# Patient Record
Sex: Female | Born: 1950 | Race: White | Hispanic: No | Smoking: Former smoker
Health system: Southern US, Community
[De-identification: ages and names within clinical notes are randomized; demographics above are authoritative.]

## PROBLEM LIST (undated history)

## (undated) DIAGNOSIS — E7800 Pure hypercholesterolemia, unspecified: Secondary | ICD-10-CM

## (undated) DIAGNOSIS — I10 Essential (primary) hypertension: Secondary | ICD-10-CM

## (undated) DIAGNOSIS — I739 Peripheral vascular disease, unspecified: Secondary | ICD-10-CM

## (undated) HISTORY — PX: OTHER SURGICAL HISTORY: SHX169

## (undated) HISTORY — PX: ABDOMINAL HYSTERECTOMY: SHX81

## (undated) HISTORY — DX: Peripheral vascular disease, unspecified: I73.9

## (undated) HISTORY — PX: ENDOVENOUS ABLATION SAPHENOUS VEIN W/ LASER: SUR449

---

## 1997-12-22 ENCOUNTER — Other Ambulatory Visit: Admission: RE | Admit: 1997-12-22 | Discharge: 1997-12-22 | Payer: Self-pay | Admitting: Gastroenterology

## 1998-04-25 ENCOUNTER — Emergency Department (HOSPITAL_COMMUNITY): Admission: EM | Admit: 1998-04-25 | Discharge: 1998-04-25 | Payer: Self-pay | Admitting: Emergency Medicine

## 1998-04-25 ENCOUNTER — Encounter: Payer: Self-pay | Admitting: Emergency Medicine

## 1998-08-23 ENCOUNTER — Ambulatory Visit (HOSPITAL_COMMUNITY): Admission: RE | Admit: 1998-08-23 | Discharge: 1998-08-23 | Payer: Self-pay | Admitting: *Deleted

## 1999-09-26 ENCOUNTER — Encounter: Admission: RE | Admit: 1999-09-26 | Discharge: 1999-09-26 | Payer: Self-pay | Admitting: Specialist

## 1999-09-26 ENCOUNTER — Encounter: Payer: Self-pay | Admitting: Specialist

## 2015-05-26 ENCOUNTER — Emergency Department (HOSPITAL_COMMUNITY)
Admission: EM | Admit: 2015-05-26 | Discharge: 2015-05-26 | Disposition: A | Discharge status: Home / Self Care | Payer: BLUE CROSS/BLUE SHIELD | Attending: Emergency Medicine | Admitting: Emergency Medicine

## 2015-05-26 ENCOUNTER — Emergency Department (HOSPITAL_COMMUNITY): Payer: BLUE CROSS/BLUE SHIELD

## 2015-05-26 ENCOUNTER — Encounter (HOSPITAL_COMMUNITY): Payer: Self-pay

## 2015-05-26 DIAGNOSIS — S8251XA Displaced fracture of medial malleolus of right tibia, initial encounter for closed fracture: Secondary | ICD-10-CM | POA: Diagnosis not present

## 2015-05-26 DIAGNOSIS — I10 Essential (primary) hypertension: Secondary | ICD-10-CM | POA: Diagnosis not present

## 2015-05-26 DIAGNOSIS — W1842XA Slipping, tripping and stumbling without falling due to stepping into hole or opening, initial encounter: Secondary | ICD-10-CM | POA: Diagnosis not present

## 2015-05-26 DIAGNOSIS — Z8639 Personal history of other endocrine, nutritional and metabolic disease: Secondary | ICD-10-CM | POA: Insufficient documentation

## 2015-05-26 DIAGNOSIS — S8261XA Displaced fracture of lateral malleolus of right fibula, initial encounter for closed fracture: Secondary | ICD-10-CM | POA: Diagnosis not present

## 2015-05-26 DIAGNOSIS — Y998 Other external cause status: Secondary | ICD-10-CM | POA: Insufficient documentation

## 2015-05-26 DIAGNOSIS — S82891A Other fracture of right lower leg, initial encounter for closed fracture: Secondary | ICD-10-CM

## 2015-05-26 DIAGNOSIS — Y9389 Activity, other specified: Secondary | ICD-10-CM | POA: Diagnosis not present

## 2015-05-26 DIAGNOSIS — S99911A Unspecified injury of right ankle, initial encounter: Secondary | ICD-10-CM | POA: Diagnosis present

## 2015-05-26 DIAGNOSIS — Y92007 Garden or yard of unspecified non-institutional (private) residence as the place of occurrence of the external cause: Secondary | ICD-10-CM | POA: Diagnosis not present

## 2015-05-26 HISTORY — DX: Pure hypercholesterolemia, unspecified: E78.00

## 2015-05-26 HISTORY — DX: Essential (primary) hypertension: I10

## 2015-05-26 MED ORDER — ONDANSETRON 4 MG PO TBDP
4.0000 mg | ORAL_TABLET | Freq: Once | ORAL | Status: AC
  Administered 2015-05-26: 4 mg via ORAL
  Filled 2015-05-26: qty 1

## 2015-05-26 MED ORDER — HYDROCODONE-ACETAMINOPHEN 5-325 MG PO TABS
1.0000 | ORAL_TABLET | Freq: Once | ORAL | Status: AC
  Administered 2015-05-26: 1 via ORAL
  Filled 2015-05-26: qty 1

## 2015-05-26 MED ORDER — HYDROCODONE-ACETAMINOPHEN 5-325 MG PO TABS: ORAL_TABLET | ORAL | Status: DC

## 2015-05-26 MED ORDER — NAPROXEN 500 MG PO TABS: 500.0000 mg | ORAL_TABLET | Freq: Two times a day (BID) | ORAL | Status: DC

## 2015-05-26 NOTE — ED Notes (Signed)
Pt verbalized understanding of no driving and to use caution within 4 hours of taking pain meds due to meds cause drowsiness 

## 2015-05-26 NOTE — ED Notes (Signed)
Pt reports stepped in a  Hole and hurt right ankle last night.  Swelling and bruising noted.  Pedal pulse present.

## 2015-05-26 NOTE — Discharge Instructions (Signed)
Walking Boot °A walking boot (controlled ankle motion boot or CAM walker) is a removable boot-shaped splint that holds your foot or ankle in place after an injury or a medical procedure. This helps with healing and prevents further injury. A walking boot has a stiff, rigid outer frame that limits movement and supports your leg and foot. The inner lining is a layer of padded material. Walking boots usually have several adjustable straps to secure them over the foot. °Your health care provider may prescribe a walking boot if it is okay for you to use your injured foot to support your body weight. How much you can walk with the boot on will depend on the type and severity of your injury. Your health care provider will recommend the best boot for you based on your condition. °HOW DO I PUT ON MY WALKING BOOT? °There are different types of walking boots. Each type of boot has specific instructions about how to wear it properly. Follow instructions from your health care provider about wearing yours. In general: °· Sit down to put on your boot. This is more comfortable and it helps to prevent falls. °· Open up the boot fully. Place your foot into the boot so that your heel rests against the back. °· Your toes should be supported by the base of the boot, but they should not hang over the front. °· Adjust the straps so the boot fits securely but is not too tight. °· Do not bend the hard frame of the boot to get a good fit. °· Ask someone to help you put on the boot, if needed. °WHAT ARE SOME TIPS FOR WALKING WITH A WALKING BOOT? °· Do not try to walk without wearing the boot unless your health care provider has approved. °· Rest your injured leg as much as possible. °· Use other assistive walking devices as told by your health care provider. These include crutches and canes. °· On your other foot, wear a shoe with a heel that is close to the height of the boot. °· Be very careful when walking on surfaces that are uneven or  wet. °HOW CAN I REDUCE SWELLING? °· Rest your injured foot or leg as much as possible. °· If directed, apply ice to the injured area: °¨ Put ice in a plastic bag. °¨ Place a towel between your skin and the bag. °¨ Leave the ice on for 20 minutes, 2-3 times a day for two days or as told by your health care provider. °· Keep your injured leg raised (elevated) above the level of your heart for 2-3 hours each day or as told by your health care provider. °· If swelling gets worse, loosen the boot and rest and raise your foot. °· Contact your health care provider if swelling does not get better or if it gets worse over time. °WHAT SKIN CARE PRACTICES SHOULD I FOLLOW? °· Wear a long sock to protect your foot and leg from rubbing inside the boot. °· Take off the boot one time per day to check the injured area. °· Follow instructions from your health care provider about taking care of your incision or wound, if this applies. °· Clean and wash the injured area as told by your health care provider. °· Gently dry your foot and leg before putting the boot back on. °· Contact your health care provider if a wound is getting worse or if your skin becomes red, painful, or irritated. °ARE THERE ANY ACTIVITY RESTRICTIONS? °Activity   restrictions depend on the type and severity of your injury. Follow instructions from your health care provider. °· Bathe and shower as directed by your health care provider. °· Do not do activities that could make your injury worse. °· Do not drive if your affected foot is one that you usually use for driving. °HOW SHOULD I KEEP MY BOOT CLEAN? °· Clean the frame and the liner of the boot by hand. Use a washcloth with mild soap and water. °· Do not use chemical cleaning products. These could irritate your skin, especially if you have a wound or an incision. °· Do not soak the liner of the boot. °· Do not put any part of the boot in a washing machine or a clothes dryer. °· Allow the boot to air dry  completely before you put it back on your foot. °  °This information is not intended to replace advice given to you by your health care provider. Make sure you discuss any questions you have with your health care provider. °  °Document Released: 11/16/2014 Document Reviewed: 11/16/2014 °Elsevier Interactive Patient Education ©2016 [employer] ° °

## 2015-05-27 NOTE — ED Provider Notes (Signed)
CSN: [id number]     Arrival date & time 05/26/15  1134 History   First MD Initiated Contact with Patient 05/26/15 1200     Chief Complaint  Patient presents with  . Ankle Pain     (Consider location/radiation/quality/duration/timing/severity/associated sxs/prior Treatment) HPI  Carolyn Robinson is a 64 y.o. female who presents to the Emergency Department complaining of right ankle pain and swelling.  She states that she stepped in a hole in the yard on the evening prior to arrival.  She complains of throbbing pain to the ankle all evening and she is unable to bear weight due to pain.  She states she has kept her foot elevated and ice applied.  She denies numbness or weakness, proximal pain, open wounds.   Past Medical History  Diagnosis Date  . Hypertension   . Hypercholesterolemia    Past Surgical History  Procedure Laterality Date  . Stent in r leg    . Abdominal hysterectomy     No family history on file. Social History  Substance Use Topics  . Smoking status: Never Smoker   . Smokeless tobacco: None  . Alcohol Use: Yes     Comment: occ   OB History    No data available     Review of Systems  Constitutional: Negative for fever and chills.  Musculoskeletal: Positive for joint swelling and arthralgias (right ankle pain).  Skin: Negative for color change and wound.  Neurological: Negative for weakness and numbness.  All other systems reviewed and are negative.     Allergies  Review of patient's allergies indicates no known allergies.  Home Medications   Prior to Admission medications   Medication Sig Start Date End Date Taking? Authorizing Provider  HYDROcodone-acetaminophen (NORCO/VICODIN) 5-325 MG tablet Take one tab po q 4-6 hrs prn pain 05/26/15   Jaydeen Odor, PA-C  naproxen (NAPROSYN) 500 MG tablet Take 1 tablet (500 mg total) by mouth 2 (two) times daily with a meal. 05/26/15   Tanaja Ganger, PA-C   BP 150/95 mmHg  Pulse 84  Temp(Src) 97.9 F (36.6  C) (Oral)  Resp 18  Ht  (1.575 m)  Wt 135 lb (61.236 kg)  BMI 24.69 kg/m2  SpO2 100% Physical Exam  Constitutional: She is oriented to person, place, and time. She appears well-developed and well-nourished. No distress.  HENT:  Head: Atraumatic.  Neck: Normal range of motion.  Cardiovascular: Normal rate, regular rhythm and intact distal pulses.   Pulmonary/Chest: Effort normal and breath sounds normal. She exhibits no tenderness.  Musculoskeletal: She exhibits edema and tenderness.  Mild to moderate edema and ecchymosis of the medial and lateral right ankle.  DP pulse is brisk,distal sensation intact.  No bony deformity.  No proximal tenderness. Compartments soft  Neurological: She is alert and oriented to person, place, and time. She exhibits normal muscle tone. Coordination normal.  Skin: Skin is warm and dry.  Nursing note and vitals reviewed.   ED Course  Procedures (including critical care time) Labs Review Labs Reviewed - No data to display  Imaging Review Dg Ankle Complete Right  05/26/2015  CLINICAL DATA:  Ankle pain. EXAM: RIGHT ANKLE - COMPLETE 3+ VIEW COMPARISON:  No prior exams available for comparison. FINDINGS: Prominent soft tissue swelling is noted lateral malleolus. Tiny avulsion fracture from the lateral malleolus noted. Subtle medial malleolar nondisplaced fracture cannot be completely excluded. No other focal abnormality identified. IMPRESSION: 1. Prominent soft tissue swelling noted over the lateral malleolus. Subtle avulsion fracture from  the distal tip of the lateral malleolus is noted. 2. Cannot exclude subtle avulsion fracture from the distal tip of the medial malleolus. Electronically Signed   By: Maisie Fushomas  Register   On: 05/26/2015 12:48   I have personally reviewed and evaluated these images and lab results as part of my medical decision-making.    MDM   Final diagnoses:  Ankle fracture, right, closed, initial encounter    Pt is well appearing.   Cam walker applied.  Pain improved, remains NV intact.  Pt agrees to close orthopedic f/u.  rx for naprosyn and vicodin.      Pauline Ausammy Lema Heinkel, PA-C 05/27/15 2248  Donnetta HutchingBrian Cook, MD 05/30/15 1130

## 2015-06-02 ENCOUNTER — Ambulatory Visit (INDEPENDENT_AMBULATORY_CARE_PROVIDER_SITE_OTHER): Payer: BLUE CROSS/BLUE SHIELD | Admitting: Orthopedic Surgery

## 2015-06-02 DIAGNOSIS — S82841A Displaced bimalleolar fracture of right lower leg, initial encounter for closed fracture: Secondary | ICD-10-CM | POA: Diagnosis not present

## 2015-06-02 MED ORDER — HYDROCODONE-ACETAMINOPHEN 5-325 MG PO TABS: 1.0000 | ORAL_TABLET | Freq: Four times a day (QID) | ORAL | Status: DC | PRN

## 2015-06-02 NOTE — Patient Instructions (Signed)
CONTINUE WEARING BRACE

## 2015-06-02 NOTE — Progress Notes (Signed)
   Subjective:    Patient ID: Carolyn Robinson, female    DOB: 06/23/1951, 64 y.o.   MRN: [id number] Right ankle pain.   HPI On 05/26/2015 the patient was at home came down a step tripped her right foot and ankle went into a divot in the ground and she twisted and fell fracturing her medial lateral malleolus but they're both nondisplaced fracture. She complains of pain initially severe now moderate over the medial and lateral aspects of the right ankle with painful weightbearing swelling and some loss of motion. Symptoms are worse with ambulating. She has much relief with a Cam Walker and Norco for pain   Review of Systems neurologic symptoms negative for numbness and tingling Skin ecchymotic    Objective:   Physical Exam Normal grooming and hygiene She is awake alert and oriented 3 mood and affect are normal Gen. appearance ectomorphic body habitus Ambulatory status supported by Verdene Rioam Walker she has a noticeable limp Right ankle was tenderness medial and lateral malleoli just distal to the tips of each, we see swelling throughout the foot and ankle Flexion dorsiflexion is 5 plantar flexion is 20 Stability drawer test is normal Plantar flexion strength is excellent grade 5 Sensation remains intact throughout the foot Skin ecchymotic but intact Pulses good good capillary refill Left ankle comparison normal range of motion stability and strength neurovascular exam intact skin normal        Assessment & Plan:  Right ankle medial and lateral avulsion fractures  The report is below my interpretation is that these are tiny avulsion fractures consistent more with sprain of the ankle than fracture  CLINICAL DATA: Ankle pain.  EXAM: RIGHT ANKLE - COMPLETE 3+ VIEW  COMPARISON: No prior exams available for comparison.  FINDINGS: Prominent soft tissue swelling is noted lateral malleolus. Tiny avulsion fracture from the lateral malleolus noted. Subtle medial malleolar nondisplaced  fracture cannot be completely excluded. No other focal abnormality identified.  IMPRESSION: 1. Prominent soft tissue swelling noted over the lateral malleolus. Subtle avulsion fracture from the distal tip of the lateral malleolus is noted.  2. Cannot exclude subtle avulsion fracture from the distal tip of the medial malleolus.   Electronically Signed  By: Maisie Fushomas Register  On: 05/26/2015 12:48   My plan is to keep her in a Cam Walker weightbearing as tolerated return 6 weeks for clinical evaluation

## 2015-07-07 ENCOUNTER — Ambulatory Visit (INDEPENDENT_AMBULATORY_CARE_PROVIDER_SITE_OTHER): Payer: BLUE CROSS/BLUE SHIELD | Admitting: Orthopedic Surgery

## 2015-07-07 VITALS — BP 154/103 | Ht 62.0 in | Wt 135.0 lb

## 2015-07-07 DIAGNOSIS — S82841S Displaced bimalleolar fracture of right lower leg, sequela: Secondary | ICD-10-CM | POA: Diagnosis not present

## 2015-07-07 NOTE — Patient Instructions (Signed)
Regular shoes, keep moving

## 2015-07-07 NOTE — Progress Notes (Signed)
FOLLOW UP  Chief Complaint  Patient presents with  . Follow-up    5 week recheck on right ankle fracture, DOI 05-26-15. No xray needed this visit.    Avulsion fractures medial and lateral malleolus of the right ankle treated with Cam Walker for 6-7 weeks. She has some mild swelling laterally and stiffness but clinical exam is normal except for the swelling  Recommend weightbearing as tolerated active range of motion exercises follow-up as needed

## 2015-11-14 ENCOUNTER — Ambulatory Visit: Payer: BLUE CROSS/BLUE SHIELD | Admitting: Orthopedic Surgery

## 2015-11-28 ENCOUNTER — Ambulatory Visit (INDEPENDENT_AMBULATORY_CARE_PROVIDER_SITE_OTHER): Payer: BLUE CROSS/BLUE SHIELD

## 2015-11-28 ENCOUNTER — Ambulatory Visit (INDEPENDENT_AMBULATORY_CARE_PROVIDER_SITE_OTHER): Payer: BLUE CROSS/BLUE SHIELD | Admitting: Orthopedic Surgery

## 2015-11-28 VITALS — BP 166/98 | HR 94 | Ht 62.0 in | Wt 136.0 lb

## 2015-11-28 DIAGNOSIS — M25571 Pain in right ankle and joints of right foot: Secondary | ICD-10-CM

## 2015-11-28 MED ORDER — NABUMETONE 500 MG PO TABS: 500.0000 mg | ORAL_TABLET | Freq: Two times a day (BID) | ORAL | Status: DC

## 2015-11-28 NOTE — Progress Notes (Signed)
Patient ID: Luana ShuJanice Pfiffner, female   DOB: 10/30/1950, 65 y.o.   MRN: [id number]  Chief Complaint  Patient presents with  . Ankle Pain    Right ankle pain and swelling, DOI 07/06/16    HPI 65 year old female had an avulsion fracture to her right ankle back in November it healed well but she comes in complaining of continued pain and swelling especially after being on her feet a long time  Location right ankle Quality of dull ache Severity moderate Duration since November Timing with activity Unrelieved by Naprosyn  Review of Systems  Constitutional: Negative for fever and chills.  Neurological: Negative for tingling.     Past Medical History  Diagnosis Date  . Hypertension   . Hypercholesterolemia     BP 166/98 mmHg  Pulse 94  Ht 5\' 2"  (1.575 m)  Wt 136 lb (61.689 kg)  BMI 24.87 kg/m2  Physical Exam Physical Exam  Constitutional: The patient is oriented to person, place, and time. The patient appears well-developed and well-nourished. No distress.  Cardiovascular: Intact distal pulses.   Neurological: The patient is alert and oriented to person, place, and time. The patient exhibits normal muscle tone. Coordination normal.  Skin: Skin is warm and dry. No rash noted. The patient is not diaphoretic. No erythema. No pallor.  Psychiatric: The patient has a normal mood and affect. Her behavior is normal. Judgment and thought content normal.   Ortho Exam  Left ankle normal range of motion stability  Right ankle is tender medially and laterally but there is no swelling range of motion is normal she has some pain with plantar flexion ankle is stable no pain strength is normal inversion and eversion dorsiflexion plantar flexion skin is normal no rash no ulceration no calluses. Pulses are normal temperature is normal no edema. Sensation is normal. Ambulation no issues there. Gait is normal    ASSESSMENT AND PLAN   X-ray shows no fracture normal ankle joint  Impression ankle pain  inflammation after avulsion fracture primarily ligament injury  Recommend anti-inflammatory  Meds ordered this encounter  Medications  . nabumetone (RELAFEN) 500 MG tablet    Sig: Take 1 tablet (500 mg total) by mouth 2 (two) times daily.    Dispense:  60 tablet    Refill:  5

## 2018-11-24 LAB — HM COLONOSCOPY

## 2019-05-08 ENCOUNTER — Other Ambulatory Visit: Payer: Self-pay

## 2019-05-08 ENCOUNTER — Encounter (HOSPITAL_COMMUNITY): Payer: Self-pay | Admitting: Emergency Medicine

## 2019-05-08 DIAGNOSIS — K5900 Constipation, unspecified: Secondary | ICD-10-CM | POA: Diagnosis not present

## 2019-05-08 DIAGNOSIS — Z20828 Contact with and (suspected) exposure to other viral communicable diseases: Secondary | ICD-10-CM | POA: Diagnosis not present

## 2019-05-08 DIAGNOSIS — R197 Diarrhea, unspecified: Secondary | ICD-10-CM | POA: Diagnosis not present

## 2019-05-08 DIAGNOSIS — R110 Nausea: Secondary | ICD-10-CM | POA: Insufficient documentation

## 2019-05-08 DIAGNOSIS — N390 Urinary tract infection, site not specified: Secondary | ICD-10-CM | POA: Insufficient documentation

## 2019-05-08 DIAGNOSIS — I10 Essential (primary) hypertension: Secondary | ICD-10-CM | POA: Insufficient documentation

## 2019-05-08 DIAGNOSIS — R109 Unspecified abdominal pain: Secondary | ICD-10-CM | POA: Diagnosis present

## 2019-05-08 DIAGNOSIS — Z79899 Other long term (current) drug therapy: Secondary | ICD-10-CM | POA: Diagnosis not present

## 2019-05-08 DIAGNOSIS — K3580 Unspecified acute appendicitis: Secondary | ICD-10-CM | POA: Insufficient documentation

## 2019-05-08 LAB — CBC
HCT: 47.3 % — ABNORMAL HIGH (ref 36.0–46.0)
Hemoglobin: 15.6 g/dL — ABNORMAL HIGH (ref 12.0–15.0)
MCH: 34.5 pg — ABNORMAL HIGH (ref 26.0–34.0)
MCHC: 33 g/dL (ref 30.0–36.0)
MCV: 104.6 fL — ABNORMAL HIGH (ref 80.0–100.0)
Platelets: 269 10*3/uL (ref 150–400)
RBC: 4.52 MIL/uL (ref 3.87–5.11)
RDW: 14.7 % (ref 11.5–15.5)
WBC: 9.5 10*3/uL (ref 4.0–10.5)
nRBC: 0 % (ref 0.0–0.2)

## 2019-05-08 LAB — COMPREHENSIVE METABOLIC PANEL
ALT: 43 U/L (ref 0–44)
AST: 71 U/L — ABNORMAL HIGH (ref 15–41)
Albumin: 4.3 g/dL (ref 3.5–5.0)
Alkaline Phosphatase: 95 U/L (ref 38–126)
Anion gap: 9 (ref 5–15)
BUN: 10 mg/dL (ref 8–23)
CO2: 24 mmol/L (ref 22–32)
Calcium: 9.7 mg/dL (ref 8.9–10.3)
Chloride: 105 mmol/L (ref 98–111)
Creatinine, Ser: 0.97 mg/dL (ref 0.44–1.00)
GFR calc Af Amer: >60 mL/min (ref >60)
GFR calc non Af Amer: >60 mL/min (ref >60)
Glucose, Bld: 103 mg/dL — ABNORMAL HIGH (ref 70–99)
Potassium: 4.2 mmol/L (ref 3.5–5.1)
Sodium: 138 mmol/L (ref 135–145)
Total Bilirubin: 0.7 mg/dL (ref 0.3–1.2)
Total Protein: 7.9 g/dL (ref 6.5–8.1)

## 2019-05-08 LAB — LIPASE, BLOOD: Lipase: 21 U/L (ref 11–51)

## 2019-05-08 NOTE — ED Triage Notes (Signed)
Pt states that she is having right sided lower abd pain. It started yesterday

## 2019-05-09 ENCOUNTER — Emergency Department (HOSPITAL_COMMUNITY): Payer: Medicare HMO

## 2019-05-09 ENCOUNTER — Encounter (HOSPITAL_COMMUNITY): Payer: Self-pay | Admitting: General Surgery

## 2019-05-09 ENCOUNTER — Emergency Department (HOSPITAL_COMMUNITY)
Admission: EM | Admit: 2019-05-09 | Discharge: 2019-05-09 | Disposition: A | Discharge status: Home / Self Care | Payer: Medicare HMO | Attending: Emergency Medicine | Admitting: Emergency Medicine

## 2019-05-09 DIAGNOSIS — R109 Unspecified abdominal pain: Secondary | ICD-10-CM

## 2019-05-09 DIAGNOSIS — N390 Urinary tract infection, site not specified: Secondary | ICD-10-CM

## 2019-05-09 DIAGNOSIS — K3580 Unspecified acute appendicitis: Secondary | ICD-10-CM

## 2019-05-09 LAB — CBC WITH DIFFERENTIAL/PLATELET
Abs Immature Granulocytes: 0.05 10*3/uL (ref 0.00–0.07)
Basophils Absolute: 0.1 10*3/uL (ref 0.0–0.1)
Basophils Relative: 1 %
Eosinophils Absolute: 0.2 10*3/uL (ref 0.0–0.5)
Eosinophils Relative: 2 %
HCT: 45.3 % (ref 36.0–46.0)
Hemoglobin: 14.5 g/dL (ref 12.0–15.0)
Immature Granulocytes: 1 %
Lymphocytes Relative: 23 %
Lymphs Abs: 2.1 10*3/uL (ref 0.7–4.0)
MCH: 33.9 pg (ref 26.0–34.0)
MCHC: 32 g/dL (ref 30.0–36.0)
MCV: 105.8 fL — ABNORMAL HIGH (ref 80.0–100.0)
Monocytes Absolute: 0.6 10*3/uL (ref 0.1–1.0)
Monocytes Relative: 6 %
Neutro Abs: 6.2 10*3/uL (ref 1.7–7.7)
Neutrophils Relative %: 67 %
Platelets: 253 10*3/uL (ref 150–400)
RBC: 4.28 MIL/uL (ref 3.87–5.11)
RDW: 14.8 % (ref 11.5–15.5)
WBC: 9.1 10*3/uL (ref 4.0–10.5)
nRBC: 0 % (ref 0.0–0.2)

## 2019-05-09 LAB — URINALYSIS, ROUTINE W REFLEX MICROSCOPIC
Glucose, UA: NEGATIVE mg/dL
Hgb urine dipstick: NEGATIVE
Ketones, ur: 5 mg/dL — AB
Nitrite: NEGATIVE
Protein, ur: 30 mg/dL — AB
Specific Gravity, Urine: 1.024 (ref 1.005–1.030)
pH: 5 (ref 5.0–8.0)

## 2019-05-09 LAB — SARS CORONAVIRUS 2 BY RT PCR (HOSPITAL ORDER, PERFORMED IN ~~LOC~~ HOSPITAL LAB): SARS Coronavirus 2: NEGATIVE

## 2019-05-09 MED ORDER — FENTANYL CITRATE (PF) 100 MCG/2ML IJ SOLN
50.0000 ug | Freq: Once | INTRAMUSCULAR | Status: AC
  Administered 2019-05-09: 50 ug via INTRAVENOUS
  Filled 2019-05-09: qty 2

## 2019-05-09 MED ORDER — CEPHALEXIN 500 MG PO CAPS: 500.0000 mg | ORAL_CAPSULE | Freq: Four times a day (QID) | ORAL | 0 refills | Status: DC

## 2019-05-09 MED ORDER — METRONIDAZOLE IN NACL 5-0.79 MG/ML-% IV SOLN
500.0000 mg | Freq: Once | INTRAVENOUS | Status: AC
  Administered 2019-05-09: 500 mg via INTRAVENOUS
  Filled 2019-05-09: qty 100

## 2019-05-09 MED ORDER — ONDANSETRON HCL 4 MG/2ML IJ SOLN
4.0000 mg | Freq: Once | INTRAMUSCULAR | Status: AC
  Administered 2019-05-09: 04:00:00 4 mg via INTRAVENOUS
  Filled 2019-05-09: qty 2

## 2019-05-09 MED ORDER — IOHEXOL 300 MG/ML  SOLN
100.0000 mL | Freq: Once | INTRAMUSCULAR | Status: AC | PRN
  Administered 2019-05-09: 100 mL via INTRAVENOUS

## 2019-05-09 MED ORDER — HYDROCODONE-ACETAMINOPHEN 5-325 MG PO TABS: 1.0000 | ORAL_TABLET | ORAL | 0 refills | Status: DC | PRN

## 2019-05-09 MED ORDER — SODIUM CHLORIDE 0.9 % IV SOLN
2.0000 g | Freq: Once | INTRAVENOUS | Status: AC
  Administered 2019-05-09: 2 g via INTRAVENOUS
  Filled 2019-05-09: qty 20

## 2019-05-09 NOTE — Discharge Instructions (Signed)
We have sent prescriptions to the pharmacy of your choice to treat pain and possible urinary tract infection.  Start with a clear liquid diet then gradually advance to bland foods as tolerated.  Make sure you follow-up with Dr. Constance Haw, on Tuesday as scheduled.  Return here, if needed, for problems.

## 2019-05-09 NOTE — Consult Note (Signed)
[facility] Consult   Reason for Consult: Abdominal pain, r/o appendicitis  Referring Physician:  Dr. Wyvonnia Dusky   Chief Complaint    Abdominal Pain      HPI: Camora Tremain is a 68 y.o. female with a history of HTN and HLD who presented with a 2 day history of RLQ pain that has not improved. She says she woke up Thursday and was having pain and some associated fevers and chills. She also has had some anorexia and constipation related to these issues. She came to the ED yesterday afternoon and was finally seen after waiting about 12 hrs.  She reports that she continues to have RLQ pain. She had a urine sample she describes as dark and cloudy. She does not have any burning with urination presently.  She has had a colonoscopy 2 months ago, and was told this was normal. This was done by Dr. Posey Pronto in [place], [place]. She is worried about her colonoscopy due to an extensive family history of CRC on her mother and father's sides.   Past Medical History:  Diagnosis Date  . Hypercholesterolemia   . Hypertension     Past Surgical History:  Procedure Laterality Date  . ABDOMINAL HYSTERECTOMY    . ENDOVENOUS ABLATION SAPHENOUS VEIN W/ LASER Bilateral   . stent in r leg      Family History  Problem Relation Age of Onset  . Colon cancer Paternal Uncle   . Colon cancer Paternal Uncle   . Colon cancer Maternal Aunt   . Colon cancer Cousin     Social History   Tobacco Use  . Smoking status: Former Smoker    Quit date: 2003    Years since quitting: 17.8  . Smokeless tobacco: Never Used  Substance Use Topics  . Alcohol use: Yes    Comment: occ  . Drug use: No    Medications: I have reviewed the patient's current medications. No current facility-administered medications for this encounter.    Current Outpatient Medications  Medication Sig Dispense Refill Last Dose  . albuterol (PROVENTIL HFA;VENTOLIN HFA) 108 (90 BASE) MCG/ACT inhaler Inhale into the lungs every 6 (six)  hours as needed for wheezing or shortness of breath.     Marland Kitchen amLODipine (NORVASC) 5 MG tablet Take 5 mg by mouth 2 (two) times daily.     Marland Kitchen atorvastatin (LIPITOR) 40 MG tablet Take 40 mg by mouth daily.     . cephALEXin (KEFLEX) 500 MG capsule Take 1 capsule (500 mg total) by mouth 4 (four) times daily. 28 capsule 0   . clopidogrel (PLAVIX) 75 MG tablet Take 75 mg by mouth daily.     Marland Kitchen FLUoxetine (PROZAC) 10 MG tablet Take 10 mg by mouth daily.     . furosemide (LASIX) 20 MG tablet Take 20 mg by mouth.     Marland Kitchen HYDROcodone-acetaminophen (NORCO) 5-325 MG tablet Take 1 tablet by mouth every 4 (four) hours as needed. 20 tablet 0   . levocetirizine (XYZAL) 5 MG tablet Take 5 mg by mouth every evening.     Marland Kitchen losartan (COZAAR) 100 MG tablet Take 100 mg by mouth daily.     . nabumetone (RELAFEN) 500 MG tablet Take 1 tablet (500 mg total) by mouth 2 (two) times daily. 60 tablet 5     Allergies  Allergen Reactions  . Ace Inhibitors   . Ciprofloxacin   . Ezetimibe   . Fenofibrate   . Hctz [Hydrochlorothiazide]   . Losartan   .  Sulfa Antibiotics      ROS:  A comprehensive review of systems was negative except for: Gastrointestinal: positive for abdominal pain, constipation and nausea  Blood pressure 140/85, pulse 79, temperature 98 F (36.7 C), temperature source Oral, resp. rate 15, height 5\' 2"  (1.575 m), weight 61.2 kg, SpO2 99 %. Physical Exam Vitals signs reviewed.  Constitutional:      Appearance: She is well-developed.  HENT:     Head: Normocephalic and atraumatic.  Eyes:     Extraocular Movements: Extraocular movements intact.  Cardiovascular:     Rate and Rhythm: Normal rate.  Pulmonary:     Effort: Pulmonary effort is normal.  Abdominal:     General: Abdomen is flat. There is no distension.     Tenderness: There is abdominal tenderness in the right lower quadrant and suprapubic area.  Musculoskeletal:     Comments: Moves all extremities, no edema  Skin:    General: Skin is  warm and dry.  Neurological:     General: No focal deficit present.     Mental Status: She is alert and oriented to person, place, and time.  Psychiatric:        Mood and Affect: Mood normal.        Behavior: Behavior normal.     Results: Results for orders placed or performed during the hospital encounter of 05/09/19 (from the past 48 hour(s))  Urinalysis, Routine w reflex microscopic     Status: Abnormal   Collection Time: 05/08/19  3:58 AM  Result Value Ref Range   Color, Urine AMBER (A) YELLOW    Comment: BIOCHEMICALS MAY BE AFFECTED BY COLOR   APPearance CLOUDY (A) CLEAR   Specific Gravity, Urine 1.024 1.005 - 1.030   pH 5.0 5.0 - 8.0   Glucose, UA NEGATIVE NEGATIVE mg/dL   Hgb urine dipstick NEGATIVE NEGATIVE   Bilirubin Urine MODERATE (A) NEGATIVE   Ketones, ur 5 (A) NEGATIVE mg/dL   Protein, ur 30 (A) NEGATIVE mg/dL   Nitrite NEGATIVE NEGATIVE   Leukocytes,Ua MODERATE (A) NEGATIVE   RBC / HPF 11-20 0 - 5 RBC/hpf   WBC, UA 11-20 0 - 5 WBC/hpf   Bacteria, UA RARE (A) NONE SEEN   Squamous Epithelial / LPF 11-20 0 - 5   Mucus PRESENT    Hyaline Casts, UA PRESENT    Non Squamous Epithelial 6-10 (A) NONE SEEN    Comment: Performed at [facility], [address] [place], [place] [address]  Lipase, blood     Status: None   Collection Time: 05/08/19  7:54 PM  Result Value Ref Range   Lipase 21 11 - 51 U/L    Comment: Performed at [facility], [address] [place], [place] [address]  Comprehensive metabolic panel     Status: Abnormal   Collection Time: 05/08/19  7:54 PM  Result Value Ref Range   Sodium 138 135 - 145 mmol/L   Potassium 4.2 3.5 - 5.1 mmol/L   Chloride 105 98 - 111 mmol/L   CO2 24 22 - 32 mmol/L   Glucose, Bld 103 (H) 70 - 99 mg/dL   BUN 10 8 - 23 mg/dL   Creatinine, Ser 0.980.97 0.44 - 1.00 mg/dL   Calcium 9.7 8.9 - 11.910.3 mg/dL   Total Protein 7.9 6.5 - 8.1 g/dL   Albumin 4.3 3.5 - 5.0 g/dL   AST 71 (H) 15 - 41 U/L   ALT 43 0 - 44 U/L   Alkaline  Phosphatase  95 38 - 126 U/L   Total Bilirubin 0.7 0.3 - 1.2 mg/dL   GFR calc non Af Amer >60 >60 mL/min   GFR calc Af Amer >60 >60 mL/min   Anion gap 9 5 - 15    Comment: Performed at [facility], [address] [place], [place] [address]  CBC     Status: Abnormal   Collection Time: 05/08/19  7:54 PM  Result Value Ref Range   WBC 9.5 4.0 - 10.5 K/uL   RBC 4.52 3.87 - 5.11 MIL/uL   Hemoglobin 15.6 (H) 12.0 - 15.0 g/dL   HCT 37.0 (H) 48.8 - 89.1 %   MCV 104.6 (H) 80.0 - 100.0 fL   MCH 34.5 (H) 26.0 - 34.0 pg   MCHC 33.0 30.0 - 36.0 g/dL   RDW 69.4 50.3 - 88.8 %   Platelets 269 150 - 400 K/uL   nRBC 0.0 0.0 - 0.2 %    Comment: Performed at [facility], [address], [place], [place] [address]  SARS Coronavirus 2 by RT PCR (hospital order, performed in [facility] hospital lab) Nasopharyngeal Nasopharyngeal Swab     Status: None   Collection Time: 05/09/19  7:04 AM   Specimen: Nasopharyngeal Swab  Result Value Ref Range   SARS Coronavirus 2 NEGATIVE NEGATIVE    Comment: (NOTE) If result is NEGATIVE SARS-CoV-2 target nucleic acids are NOT DETECTED. The SARS-CoV-2 RNA is generally detectable in upper and lower  respiratory specimens during the acute phase of infection. The lowest  concentration of SARS-CoV-2 viral copies this assay can detect is 250  copies / mL. A negative result does not preclude SARS-CoV-2 infection  and should not be used as the sole basis for treatment or other  patient management decisions.  A negative result may occur with  improper specimen collection / handling, submission of specimen other  than nasopharyngeal swab, presence of viral mutation(s) within the  areas targeted by this assay, and inadequate number of viral copies  (<250 copies / mL). A negative result must be combined with clinical  observations, patient history, and epidemiological information. If result is POSITIVE SARS-CoV-2 target nucleic acids are DETECTED. The SARS-CoV-2 RNA is  generally detectable in upper and lower  respiratory specimens dur ing the acute phase of infection.  Positive  results are indicative of active infection with SARS-CoV-2.  Clinical  correlation with patient history and other diagnostic information is  necessary to determine patient infection status.  Positive results do  not rule out bacterial infection or co-infection with other viruses. If result is PRESUMPTIVE POSTIVE SARS-CoV-2 nucleic acids MAY BE PRESENT.   A presumptive positive result was obtained on the submitted specimen  and confirmed on repeat testing.  While 2019 novel coronavirus  (SARS-CoV-2) nucleic acids may be present in the submitted sample  additional confirmatory testing may be necessary for epidemiological  and / or clinical management purposes  to differentiate between  SARS-CoV-2 and other Sarbecovirus currently known to infect humans.  If clinically indicated additional testing with an alternate test  methodology [phone]) is advised. The SARS-CoV-2 RNA is generally  detectable in upper and lower respiratory sp ecimens during the acute  phase of infection. The expected result is Negative. Fact Sheet for Patients:  BoilerBrush.com.cy Fact Sheet for Healthcare Providers: https://pope.com/ This test is not yet approved or cleared by the [place] FDA and has been authorized for detection and/or diagnosis of SARS-CoV-2 by FDA under an Emergency Use Authorization (EUA).  This  EUA will remain in effect (meaning this test can be used) for the duration of the COVID-19 declaration under Section 564(b)(1) of the Act, 21 U.S.C. section 360bbb-3(b)(1), unless the authorization is terminated or revoked sooner. Performed at [facility], [address], [place], [place] [address]   CBC with Differential/Platelet     Status: Abnormal   Collection Time: 05/09/19  8:00 AM  Result Value Ref Range   WBC 9.1 4.0 - 10.5 K/uL    RBC 4.28 3.87 - 5.11 MIL/uL   Hemoglobin 14.5 12.0 - 15.0 g/dL   HCT 60.4 54.0 - 98.1 %   MCV 105.8 (H) 80.0 - 100.0 fL   MCH 33.9 26.0 - 34.0 pg   MCHC 32.0 30.0 - 36.0 g/dL   RDW 19.1 47.8 - 29.5 %   Platelets 253 150 - 400 K/uL   nRBC 0.0 0.0 - 0.2 %   Neutrophils Relative % 67 %   Neutro Abs 6.2 1.7 - 7.7 K/uL   Lymphocytes Relative 23 %   Lymphs Abs 2.1 0.7 - 4.0 K/uL   Monocytes Relative 6 %   Monocytes Absolute 0.6 0.1 - 1.0 K/uL   Eosinophils Relative 2 %   Eosinophils Absolute 0.2 0.0 - 0.5 K/uL   Basophils Relative 1 %   Basophils Absolute 0.1 0.0 - 0.1 K/uL   Immature Granulocytes 1 %   Abs Immature Granulocytes 0.05 0.00 - 0.07 K/uL    Comment: Performed at [facility], [address] [place], [place] [address]   Personally reviewed CT scan- very thin normal caliber appendix. I do not appreciate any / minimal stranding around the appendix.  No signs of acute appendicitis on my read  Ct Abdomen Pelvis W Contrast  Result Date: 05/09/2019 CLINICAL DATA:  Abdominal pain EXAM: CT ABDOMEN AND PELVIS WITH CONTRAST TECHNIQUE: Multidetector CT imaging of the abdomen and pelvis was performed using the standard protocol following bolus administration of intravenous contrast. CONTRAST:  OMNIPAQUE IOHEXOL 300 MG/ML  SOLN COMPARISON:  None. FINDINGS: Lower chest: No acute abnormality. Hepatobiliary: No focal liver abnormality is seen. No gallstones, gallbladder wall thickening, or biliary dilatation. Pancreas: Unremarkable. No pancreatic ductal dilatation or surrounding inflammatory changes. Spleen: Normal in size without focal abnormality. Adrenals/Urinary Tract: Normal appearance of the adrenal glands. The kidneys are unremarkable. No mass or hydronephrosis identified. Stomach/Bowel: Stomach normal. No bowel wall thickening, inflammation or distension. The appendix is visualized and has a normal caliber measuring 6 mm, image 49/5. There is mild peri appendiceal soft tissue  stranding. No periappendiceal free fluid or fluid collections. Unremarkable appearance of the colon. Vascular/Lymphatic: Aortic atherosclerosis. No aneurysm. No abdominopelvic adenopathy identified. Reproductive: Status post hysterectomy. No adnexal masses. Other: No free fluid or fluid collections. Musculoskeletal: No acute or significant osseous findings. IMPRESSION: 1. Appendiceal diameter is upper limits of normal in caliber measuring 6 mm. There is subtle periappendiceal soft tissue stranding without free fluid or fluid collection. Cannot rule out early appendicitis. Aortic Atherosclerosis (ICD10-I70.0). Electronically Signed   By: Signa Kell M.D.   On: 05/09/2019 06:26     Assessment & Plan:  Brian Kocourek is a 68 y.o. female with abdominal pain in the RLQ and suprapubic region. UA is dirty with some rare bacteria and moderate LE.  She had had UTI in the past.  I do not think this is appendicitis as her repeat CBC with differential is unchanged and there is no shift. Her labs from last night also have no WBC.  Her CT  is also demonstrates very little signs of appendicitis in my opinion as there is minimal to no stranding and the appendix is a normal caliber.  Her COVID is negative which is important given recent RLQ pain with COVID + patients.    -I have discussed that I think there is a low likelihood of appendicitis and that she likely has a UTI. We discussed if this is super early appendicitis antibiotics will treat this, and this is how simple appendicitis is treated in [place]. If she does have appendicitis and it worsens and she does not get better, the failure rate for antibiotics in the [place] is about 40%.   -Discussed the option of observation in the hospital versus going home. Currently [facility] has 15 patients in the ED waiting to get admitted. She is not dehydrated on labs and her pain is tolerable at the moment. Given likely delay in getting a hospital room, patient will d/c home from ED and I  will call her tomorrow and see her in Clinic 10/27 @ 10:45AM. -Keflex for UTI, Culture sent  -Dr. Effie Shy aware and will send out with some pain medication also  -She will return to the ED with worsening symptoms.   All questions were answered to the satisfaction of the patient.   Lucretia Roers 05/09/2019, 9:47 AM

## 2019-05-09 NOTE — ED Provider Notes (Signed)
I was asked by Dr. Constance Haw, general surgery, to assist with her sending the patient home.  Patient needs prescription sent to her pharmacy, and discharge instructions given.  Dr. Constance Haw plans on treating her with Keflex for UTI, Norco for pain, and seeing her in the office in 3 days for follow-up care.  I discussed this with the patient she is in agreement and has no complaints or concerns at this time.   Daleen Bo, MD 05/09/19 [phone]

## 2019-05-09 NOTE — ED Provider Notes (Signed)
[facility] EMERGENCY DEPARTMENT Provider Note   CSN: [id number] Arrival date & time: 05/08/19  1716     History   Chief Complaint Chief Complaint  Patient presents with   Abdominal Pain    HPI Carolyn Robinson is a 68 y.o. female.     Patient presents with right-sided abdominal pain progressively worsening over the past 3 days.  States the pain is in her right side and has not moved or migrated at all.  Pain is associated with nausea but no vomiting.  States she normally alternates between constipation and diarrhea and this is unchanged.  No pain with urination or blood in the urine.  No vaginal bleeding or discharge.  Previous hysterectomy and oophrectomy.  Still has appendix and gallbladder.  She has never had this kind of pain before.  Nothing makes the pain better or worse.  The history is provided by the patient.  Abdominal Pain Associated symptoms: constipation, diarrhea and nausea   Associated symptoms: no chest pain, no cough, no dysuria, no fever, no hematuria, no shortness of breath and no vomiting     Past Medical History:  Diagnosis Date   Hypercholesterolemia    Hypertension     There are no active problems to display for this patient.   Past Surgical History:  Procedure Laterality Date   ABDOMINAL HYSTERECTOMY     ENDOVENOUS ABLATION SAPHENOUS VEIN W/ LASER Bilateral    stent in r leg       OB History   No obstetric history on file.      Home Medications    Prior to Admission medications   Medication Sig Start Date End Date Taking? Authorizing Provider  albuterol (PROVENTIL HFA;VENTOLIN HFA) 108 (90 BASE) MCG/ACT inhaler Inhale into the lungs every 6 (six) hours as needed for wheezing or shortness of breath.    [provider]  amLODipine (NORVASC) 5 MG tablet Take 5 mg by mouth 2 (two) times daily.    [provider]  atorvastatin (LIPITOR) 40 MG tablet Take 40 mg by mouth daily.    [provider]  clopidogrel  (PLAVIX) 75 MG tablet Take 75 mg by mouth daily.    [provider]  FLUoxetine (PROZAC) 10 MG tablet Take 10 mg by mouth daily.    [provider]  furosemide (LASIX) 20 MG tablet Take 20 mg by mouth.    [provider]  levocetirizine (XYZAL) 5 MG tablet Take 5 mg by mouth every evening.    [provider]  losartan (COZAAR) 100 MG tablet Take 100 mg by mouth daily.    [provider]  nabumetone (RELAFEN) 500 MG tablet Take 1 tablet (500 mg total) by mouth 2 (two) times daily. 11/28/15   Vickki HearingHarrison, Stanley E, MD    Family History No family history on file.  Social History Social History   Tobacco Use   Smoking status: Never Smoker   Smokeless tobacco: Never Used  Substance Use Topics   Alcohol use: Yes    Comment: occ   Drug use: No     Allergies   Ace inhibitors, Ciprofloxacin, Ezetimibe, Fenofibrate, Hctz [hydrochlorothiazide], Losartan, and Sulfa antibiotics   Review of Systems Review of Systems  Constitutional: Positive for activity change and appetite change. Negative for fever.  HENT: Negative for congestion and rhinorrhea.   Eyes: Negative for visual disturbance.  Respiratory: Negative for cough, chest tightness and shortness of breath.   Cardiovascular: Negative for chest pain and palpitations.  Gastrointestinal:  Positive for abdominal pain, constipation, diarrhea and nausea. Negative for vomiting.  Genitourinary: Negative for dysuria and hematuria.  Musculoskeletal: Negative for arthralgias, back pain and myalgias.  Neurological: Negative for dizziness, weakness and headaches.    all other systems are negative except as noted in the HPI and PMH.    Physical Exam Updated Vital Signs BP 135/88 (BP Location: Right Arm)    Pulse 88    Temp 98 F (36.7 C) (Oral)    Resp 17    Ht 5\' 2"  (1.575 m)    Wt 61.2 kg    SpO2 100%    BMI 24.69 kg/m   Physical Exam Vitals signs and nursing note reviewed.  Constitutional:        General: She is not in acute distress.    Appearance: She is well-developed.  HENT:     Head: Normocephalic and atraumatic.     Mouth/Throat:     Pharynx: No oropharyngeal exudate.  Eyes:     Conjunctiva/sclera: Conjunctivae normal.     Pupils: Pupils are equal, round, and reactive to light.  Neck:     Musculoskeletal: Normal range of motion and neck supple.     Comments: No meningismus. Cardiovascular:     Rate and Rhythm: Normal rate and regular rhythm.     Heart sounds: Normal heart sounds. No murmur.  Pulmonary:     Effort: Pulmonary effort is normal. No respiratory distress.     Breath sounds: Normal breath sounds.  Abdominal:     Palpations: Abdomen is soft.     Tenderness: There is abdominal tenderness. There is guarding. There is no rebound.     Comments: Right-sided abdominal tenderness with guarding.  Right lower quadrant and periumbilical tenderness.  Musculoskeletal: Normal range of motion.        General: No tenderness.  Skin:    General: Skin is warm.  Neurological:     Mental Status: She is alert and oriented to person, place, and time.     Cranial Nerves: No cranial nerve deficit.     Motor: No abnormal muscle tone.     Coordination: Coordination normal.     Comments:  5/5 strength throughout. CN 2-12 intact.Equal grip strength.   Psychiatric:        Behavior: Behavior normal.      ED Treatments / Results  Labs (all labs ordered are listed, but only abnormal results are displayed) Labs Reviewed  COMPREHENSIVE METABOLIC PANEL - Abnormal; Notable for the following components:      Result Value   Glucose, Bld 103 (*)    AST 71 (*)    All other components within normal limits  CBC - Abnormal; Notable for the following components:   Hemoglobin 15.6 (*)    HCT 47.3 (*)    MCV 104.6 (*)    MCH 34.5 (*)    All other components within normal limits  URINALYSIS, ROUTINE W REFLEX MICROSCOPIC - Abnormal; Notable for the following components:   Color,  Urine AMBER (*)    APPearance CLOUDY (*)    Bilirubin Urine MODERATE (*)    Ketones, ur 5 (*)    Protein, ur 30 (*)    Leukocytes,Ua MODERATE (*)    Bacteria, UA RARE (*)    Non Squamous Epithelial 6-10 (*)    All other components within normal limits  SARS CORONAVIRUS 2 BY RT PCR (HOSPITAL ORDER, PERFORMED IN [facility])  LIPASE, BLOOD  DIFFERENTIAL    EKG None  Radiology Ct Abdomen Pelvis W Contrast  Result Date: 05/09/2019 CLINICAL DATA:  Abdominal pain EXAM: CT ABDOMEN AND PELVIS WITH CONTRAST TECHNIQUE: Multidetector CT imaging of the abdomen and pelvis was performed using the standard protocol following bolus administration of intravenous contrast. CONTRAST:  121mL OMNIPAQUE IOHEXOL 300 MG/ML  SOLN COMPARISON:  None. FINDINGS: Lower chest: No acute abnormality. Hepatobiliary: No focal liver abnormality is seen. No gallstones, gallbladder wall thickening, or biliary dilatation. Pancreas: Unremarkable. No pancreatic ductal dilatation or surrounding inflammatory changes. Spleen: Normal in size without focal abnormality. Adrenals/Urinary Tract: Normal appearance of the adrenal glands. The kidneys are unremarkable. No mass or hydronephrosis identified. Stomach/Bowel: Stomach normal. No bowel wall thickening, inflammation or distension. The appendix is visualized and has a normal caliber measuring 6 mm, image 49/5. There is mild peri appendiceal soft tissue stranding. No periappendiceal free fluid or fluid collections. Unremarkable appearance of the colon. Vascular/Lymphatic: Aortic atherosclerosis. No aneurysm. No abdominopelvic adenopathy identified. Reproductive: Status post hysterectomy. No adnexal masses. Other: No free fluid or fluid collections. Musculoskeletal: No acute or significant osseous findings. IMPRESSION: 1. Appendiceal diameter is upper limits of normal in caliber measuring 6 mm. There is subtle periappendiceal soft tissue stranding without free fluid or fluid  collection. Cannot rule out early appendicitis. Aortic Atherosclerosis (ICD10-I70.0). Electronically Signed   By: Kerby Moors M.D.   On: 05/09/2019 06:26    Procedures Procedures (including critical care time)  Medications Ordered in ED Medications  fentaNYL (SUBLIMAZE) injection 50 mcg (has no administration in time range)  ondansetron (ZOFRAN) injection 4 mg (has no administration in time range)     Initial Impression / Assessment and Plan / ED Course  I have reviewed the triage vital signs and the nursing notes.  Pertinent labs & imaging results that were available during my care of the patient were reviewed by me and considered in my medical decision making (see chart for details).       Right-sided abdominal pain with nausea.  Concern for appendicitis. UA with moderate leukocytes and bacteria. Culture sent.   Imaging confirms early appendicitis without perforation or abscess.  Discussed with Dr. Constance Haw of general surgery who will evaluate.  Patient started on antibiotics And will remain n.p.o.  Coronavirus test sent.  Patient updated.  Carolyn Robinson was evaluated in Emergency Department on 05/09/2019 for the symptoms described in the history of present illness. She was evaluated in the context of the global COVID-19 pandemic, which necessitated consideration that the patient might be at risk for infection with the SARS-CoV-2 virus that causes COVID-19. Institutional protocols and algorithms that pertain to the evaluation of patients at risk for COVID-19 are in a state of rapid change based on information released by regulatory bodies including the CDC and federal and state organizations. These policies and algorithms were followed during the patient's care in the ED.  Final Clinical Impressions(s) / ED Diagnoses   Final diagnoses:  Acute appendicitis, unspecified acute appendicitis type    ED Discharge Orders    None       Cheney Ewart, Annie Main, MD 05/09/19 [phone]

## 2019-05-09 NOTE — ED Notes (Signed)
Dr. Constance Haw talking with patient.

## 2019-05-10 ENCOUNTER — Telehealth: Payer: Self-pay | Admitting: General Surgery

## 2019-05-10 DIAGNOSIS — N390 Urinary tract infection, site not specified: Secondary | ICD-10-CM

## 2019-05-10 LAB — URINE CULTURE: Culture: <10000 — AB

## 2019-05-10 NOTE — Telephone Encounter (Signed)
[facility]  Called patient to check on her after seeing in the hospital yesterday. Low suspicion for appendicitis. Labs normal.  Urine cultures still pending.  Tried patient and left message. Emergency contact did not answer.   Patient finally answered. Says she is feeling better. Norco is working. Drinking a lot of water.   Patient seeing me at 10:45 on Tuesday.   Curlene Labrum, MD [facility] [address] [place], [place] [address] [phone]/ [phone] (office)

## 2019-05-12 ENCOUNTER — Other Ambulatory Visit: Payer: Self-pay

## 2019-05-12 ENCOUNTER — Encounter: Payer: Self-pay | Admitting: General Surgery

## 2019-05-12 ENCOUNTER — Ambulatory Visit (INDEPENDENT_AMBULATORY_CARE_PROVIDER_SITE_OTHER): Payer: Medicare HMO | Admitting: General Surgery

## 2019-05-12 VITALS — BP 135/84 | HR 77 | Temp 98.4°F | Resp 16 | Ht 62.0 in | Wt 134.0 lb

## 2019-05-12 DIAGNOSIS — N390 Urinary tract infection, site not specified: Secondary | ICD-10-CM

## 2019-05-12 DIAGNOSIS — R1031 Right lower quadrant pain: Secondary | ICD-10-CM | POA: Diagnosis not present

## 2019-05-12 NOTE — Patient Instructions (Signed)
Finish antibiotic. Follow up phone call in 4 weeks. Call with worsening pain, fever, nausea/vomiting.

## 2019-05-12 NOTE — Progress Notes (Signed)
[facility] Note   HPI:  68 y.o. Female presents to clinic for follow-up evaluation after being in the hospital with RLQ pain. She says her pain is 2/10 and overall she is better. She was treated for possible UTI. Her culture came back with < 10K colonies and was not speciated.  Difficult to know if she truly had UTI or no?   Review of Systems:  No fever or chills Pain improving  All other review of systems: otherwise negative   Vital Signs:  BP 135/84 (BP Location: Left Arm, Patient Position: Sitting, Cuff Size: Normal)   Pulse 77   Temp 98.4 F (36.9 C) (Oral)   Resp 16   Ht 5\' 2"  (1.575 m)   Wt 134 lb (60.8 kg)   SpO2 95%   BMI 24.51 kg/m    Physical Exam:  Physical Exam Vitals signs reviewed.  HENT:     Head: Normocephalic.  Abdominal:     General: There is no distension.     Palpations: Abdomen is soft.     Tenderness: There is no abdominal tenderness.  Neurological:     Mental Status: She is alert.    Assessment:  68 y.o. yo Female with RLQ pain and possible UTI? Treated with Keflex.  Doing better overall. She is doing much better with her pain. She could have had a early appendicitis but the CT was not very convincing. At this time, I do not see any indication for any surgery. In [place], simple appendicitis is treated with antibiotics.  She had a colonoscopy a few months ago that was negative in [place], [place].  -Will call in a few weeks to ensure she is still improving -Call with worsening symptoms   Plan:  Future Appointments  Date Time Provider [facility]  06/09/2019 11:45 AM Virl Cagey, MD [facility] None    All of the above recommendations were discussed with the patient, and all of patient's questions were answered to her expressed satisfaction.  Curlene Labrum, MD [facility] [address] [place], [place] [address] [phone] (office)

## 2019-06-09 ENCOUNTER — Telehealth (INDEPENDENT_AMBULATORY_CARE_PROVIDER_SITE_OTHER): Payer: Self-pay | Admitting: General Surgery

## 2019-06-09 ENCOUNTER — Other Ambulatory Visit: Payer: Self-pay

## 2019-06-09 DIAGNOSIS — N390 Urinary tract infection, site not specified: Secondary | ICD-10-CM

## 2019-06-09 DIAGNOSIS — R1031 Right lower quadrant pain: Secondary | ICD-10-CM

## 2019-06-09 NOTE — Progress Notes (Signed)
[facility]  I am calling the patient for follow up evaluation due to the current COVID 19 pandemic.  The patient presented to the ED 10/24 with possible early appendicitis versus UTI. She was treated for UTI and followed up with me after.  She has been doing well and has no pain. She is eating and drinking and having BMs. If she develops pain in the future we will see her in follow up and work up for issues with chronic appendicitis. I think this is unlikely but have offered it to the patient.   Will see the patient PRN.   Curlene Labrum, MD [facility] [address] [place], [place] [address] [phone] (office)

## 2019-07-17 LAB — HM HEPATITIS C SCREENING LAB: HM Hepatitis Screen: NEGATIVE

## 2020-01-15 ENCOUNTER — Telehealth: Payer: Self-pay

## 2020-01-15 NOTE — Telephone Encounter (Signed)
NOTES ON FILE FROM  DR Reuel Boom POMPOSINI [phone] SENT REFERRAL TO SCHEDULING

## 2020-01-25 ENCOUNTER — Telehealth: Payer: Self-pay

## 2020-01-25 NOTE — Telephone Encounter (Signed)
REFERRAL ONLY FROM DANIEL POMPOSINI [phone]  SENT REFERRAL TO SCHEDULING

## 2020-02-05 ENCOUNTER — Encounter: Payer: Self-pay | Admitting: General Practice

## 2020-02-05 ENCOUNTER — Telehealth: Payer: Self-pay

## 2020-02-05 NOTE — Telephone Encounter (Signed)
NOTES ON FILE FROM  DR. DANIEL POMPOSINI [phone], SENT REFERRAL TO SCHEDULING

## 2020-03-03 ENCOUNTER — Telehealth: Payer: Self-pay

## 2020-03-03 NOTE — Telephone Encounter (Signed)
NOTES ON FILE FROM DANIEL POMPOSINI [phone] SENT REFERRAL TO SCHEDULING

## 2020-04-09 IMAGING — CT CT ABD-PELV W/ CM
2 of 4 series · 16 of 46 positions shown, 18 images · IV contrast (omnipaque)
Comparison: None.

CLINICAL DATA: Abdominal pain

EXAM:
CT ABDOMEN AND PELVIS WITH CONTRAST
TECHNIQUE: Multidetector CT imaging of the abdomen and pelvis was performed
using the standard protocol following bolus administration of
intravenous contrast.
CONTRAST:  100mL OMNIPAQUE IOHEXOL 300 MG/ML  SOLN

[Series 2: axial st · axial · 0.75mm/px · z∈[+808,+1228]mm · 13 of 94 slices shown, 15 images]
[im 5/94  soft-tissue]
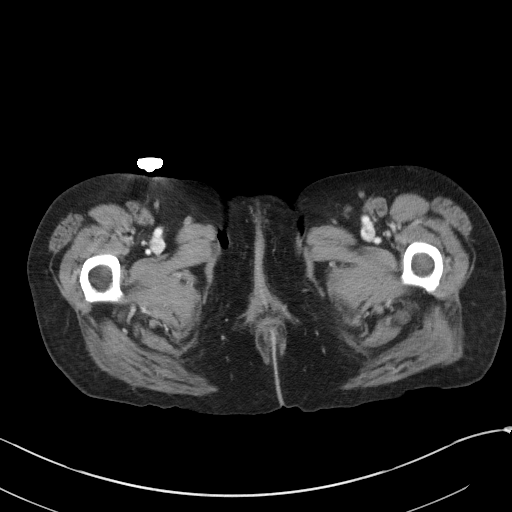
[im 5/94  bone]
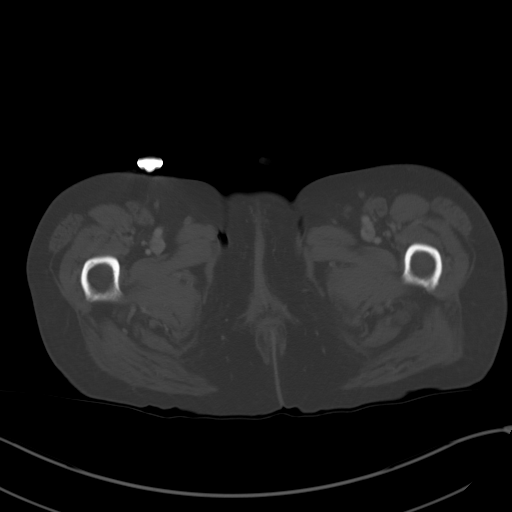
[im 13/94  soft-tissue]
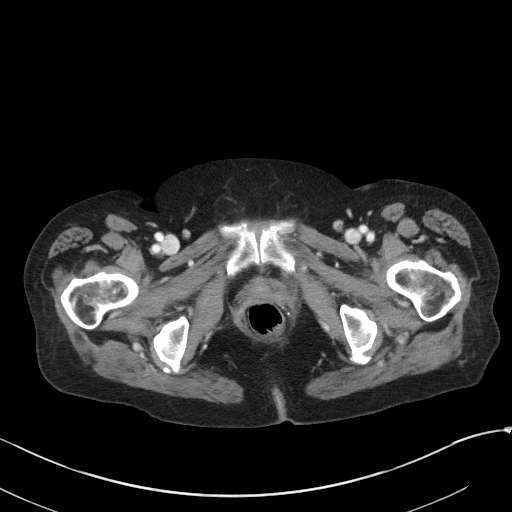
[im 22/94  soft-tissue]
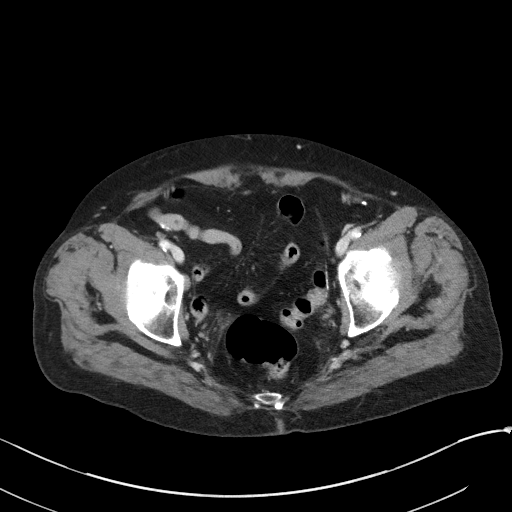
[im 26/94  soft-tissue]
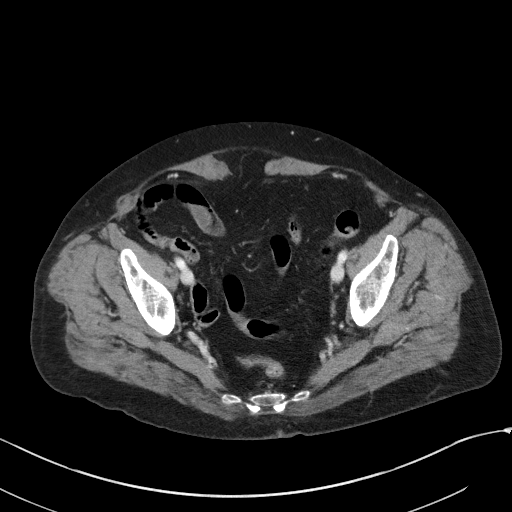
[im 34/94  soft-tissue]
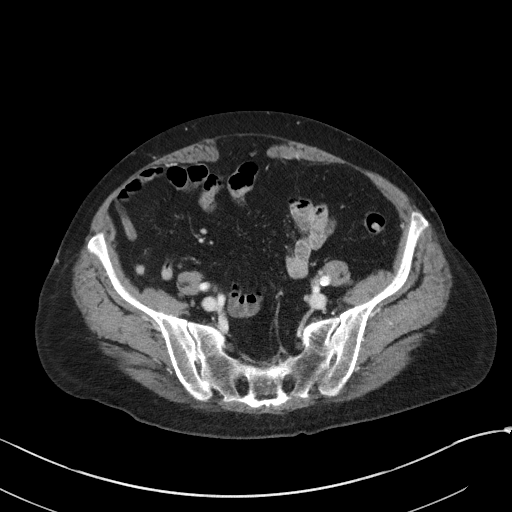
[im 39/94  soft-tissue]
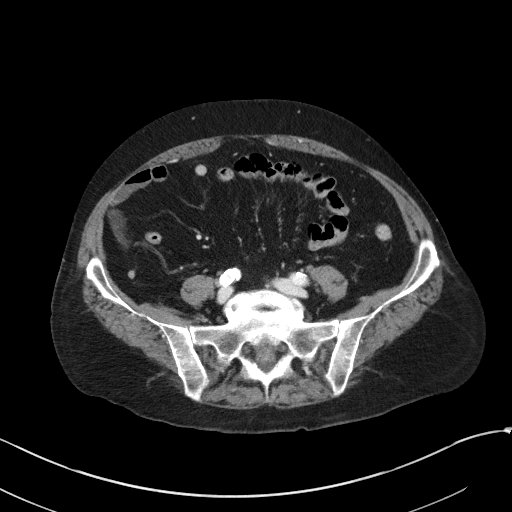
[im 47/94  soft-tissue]
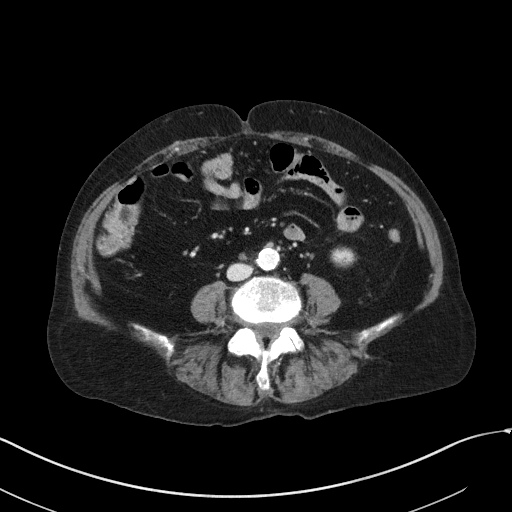
[im 55/94  soft-tissue]
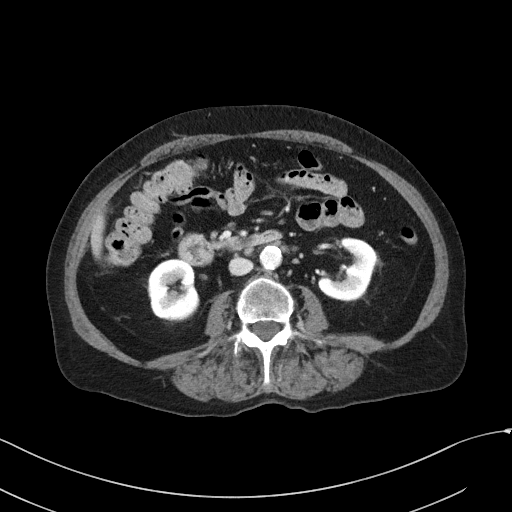
[im 60/94  soft-tissue]
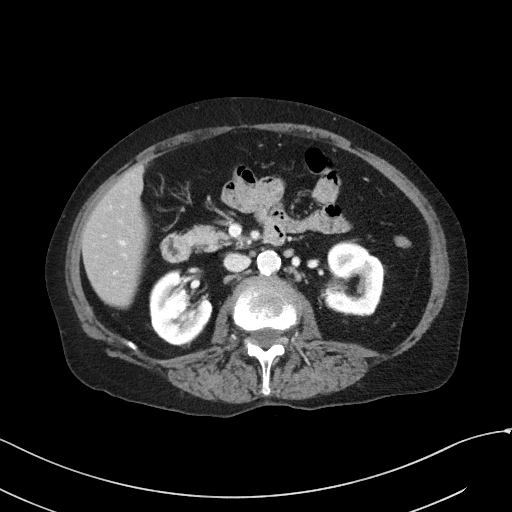
[im 60/94  bone]
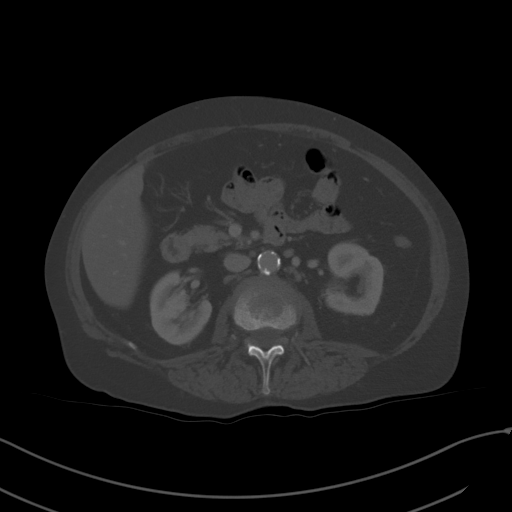
[im 68/94  soft-tissue]
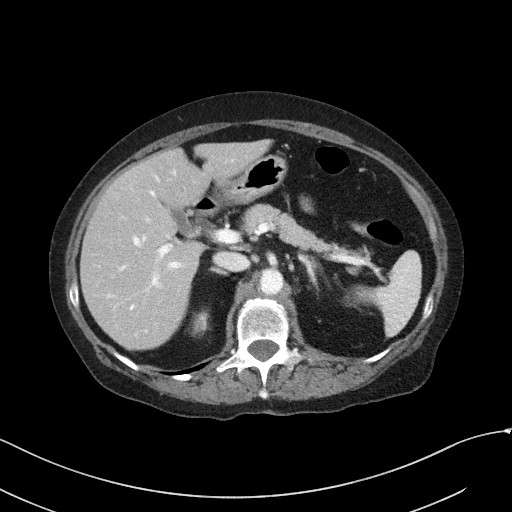
[im 72/94  soft-tissue]
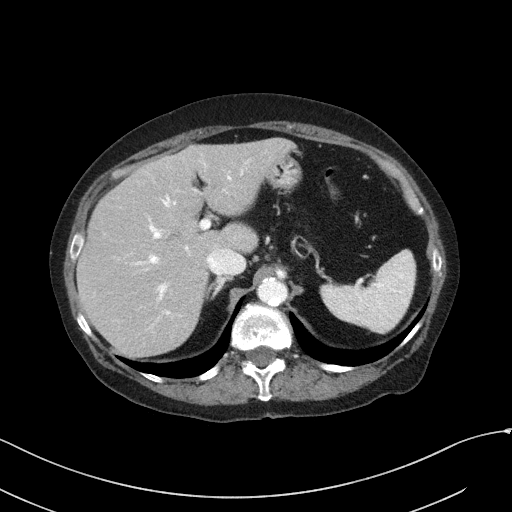
[im 81/94  soft-tissue]
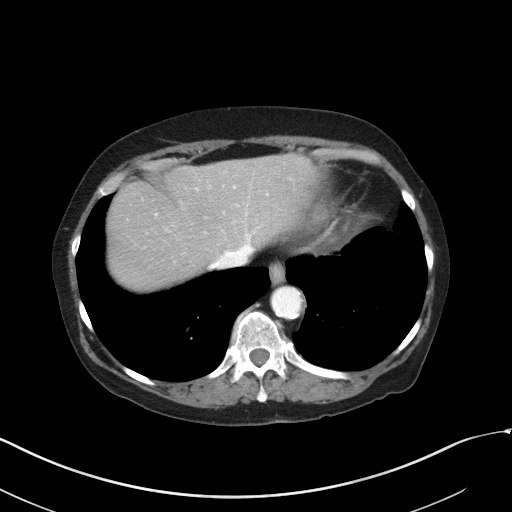
[im 89/94  soft-tissue]
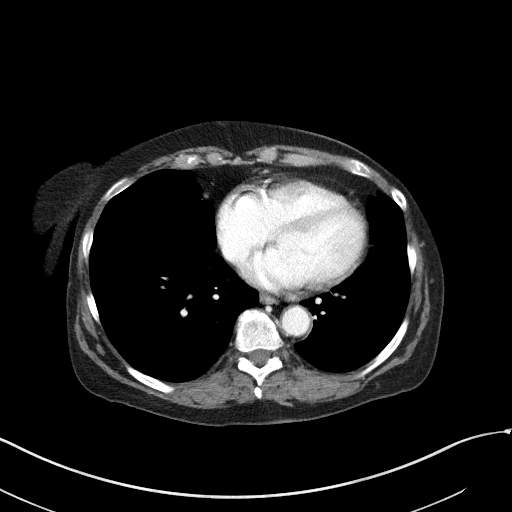

[Series 5: coronal st · coronal · 0.75mm/px · 3 of 101 slices shown]
[im 34/101  soft-tissue]
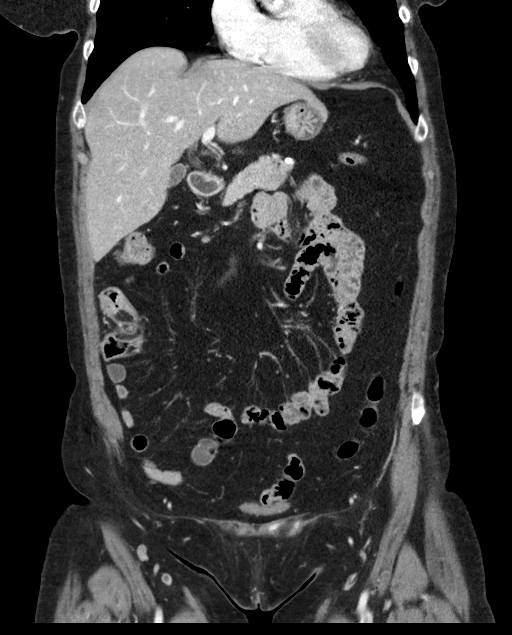
[im 45/101  soft-tissue]
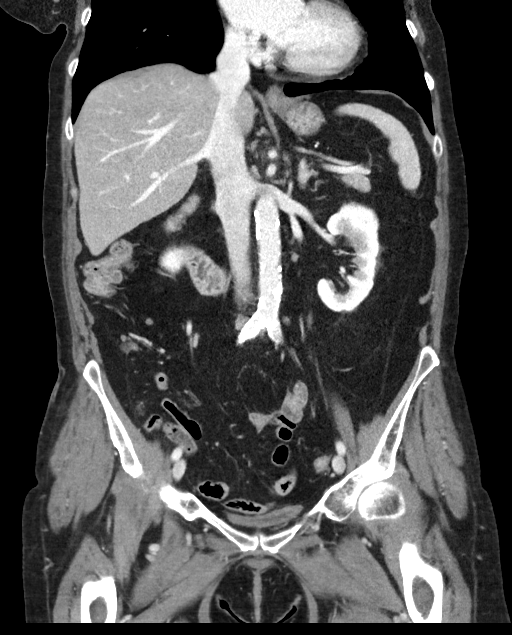
[im 56/101  soft-tissue]
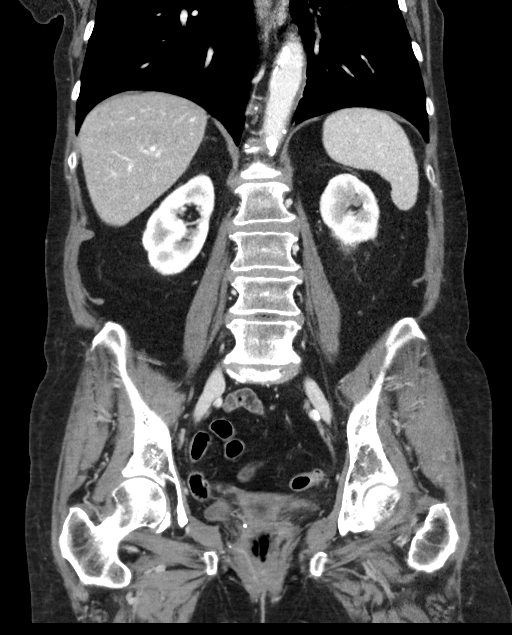

[16 of 46 positions shown; findings below may reference images not displayed]

FINDINGS: Lower chest: No acute abnormality.

Hepatobiliary: No focal liver abnormality is seen. No gallstones,
gallbladder wall thickening, or biliary dilatation.

Pancreas: Unremarkable. No pancreatic ductal dilatation or
surrounding inflammatory changes.

Spleen: Normal in size without focal abnormality.

Adrenals/Urinary Tract: Normal appearance of the adrenal glands. The
kidneys are unremarkable. No mass or hydronephrosis identified.

Stomach/Bowel: Stomach normal. No bowel wall thickening,
inflammation or distension. The appendix is visualized and has a
normal caliber measuring 6 mm, image 49/5. There is mild peri
appendiceal soft tissue stranding. No periappendiceal free fluid or
fluid collections. Unremarkable appearance of the colon.

Vascular/Lymphatic: Aortic atherosclerosis. No aneurysm. No
abdominopelvic adenopathy identified.

Reproductive: Status post hysterectomy. No adnexal masses.

Other: No free fluid or fluid collections.

Musculoskeletal: No acute or significant osseous findings.
IMPRESSION: 1. Appendiceal diameter is upper limits of normal in caliber
measuring 6 mm. There is subtle periappendiceal soft tissue
stranding without free fluid or fluid collection. Cannot rule out
early appendicitis.

Aortic Atherosclerosis ([id number].B).

## 2020-09-13 ENCOUNTER — Telehealth: Payer: Self-pay | Admitting: Licensed Clinical Social Worker

## 2020-09-13 NOTE — Telephone Encounter (Signed)
LCSW received a referral from [place], LCSW at [facility]. Pt brother inquiring about referral made for pt in 2021. LCSW passed this question to clinic scheduler Joyce Gross. She will attempt to reach pt. Per chart 4 attempts had previously been made; Joyce Gross will f/u again to get pt scheduled if able.   Carolyn Robinson, MSW, LCSW [facility] Navigation  [phone]

## 2020-10-04 ENCOUNTER — Ambulatory Visit (INDEPENDENT_AMBULATORY_CARE_PROVIDER_SITE_OTHER): Payer: Medicare HMO

## 2020-10-04 ENCOUNTER — Encounter: Payer: Self-pay | Admitting: Cardiovascular Disease

## 2020-10-04 ENCOUNTER — Other Ambulatory Visit: Payer: Self-pay

## 2020-10-04 ENCOUNTER — Encounter: Payer: Self-pay | Admitting: *Deleted

## 2020-10-04 ENCOUNTER — Ambulatory Visit (INDEPENDENT_AMBULATORY_CARE_PROVIDER_SITE_OTHER): Payer: Medicare HMO | Admitting: Cardiovascular Disease

## 2020-10-04 VITALS — BP 140/76 | HR 77 | Ht 62.0 in | Wt 140.0 lb

## 2020-10-04 DIAGNOSIS — I10 Essential (primary) hypertension: Secondary | ICD-10-CM

## 2020-10-04 DIAGNOSIS — R0989 Other specified symptoms and signs involving the circulatory and respiratory systems: Secondary | ICD-10-CM

## 2020-10-04 DIAGNOSIS — I739 Peripheral vascular disease, unspecified: Secondary | ICD-10-CM | POA: Diagnosis not present

## 2020-10-04 DIAGNOSIS — E785 Hyperlipidemia, unspecified: Secondary | ICD-10-CM

## 2020-10-04 DIAGNOSIS — R002 Palpitations: Secondary | ICD-10-CM | POA: Diagnosis not present

## 2020-10-04 NOTE — Progress Notes (Signed)
Cardiology Office Note   Date:  10/04/2020   ID:  Whitlee Sluder, DOB 03/14/1951, MRN [id number]  PCP:  Eldridge Abrahams, MD  Cardiologist:   Lorine Bears, MD   No chief complaint on file.     History of Present Illness: Carolyn Robinson is a 70 y.o. female who was referred by Dr. Jonelle Sports for evaluation of peripheral arterial disease.  The patient has history of essential hypertension, hyperlipidemia, previous tobacco use and peripheral arterial disease. She is status post right iliac artery stent placement (8 x 57 mm VISI Pro stent) and left iliac artery atherectomy?  And balloon angioplasty done by Dr. Jenness Corner in 2016 in [place] for severe claudication.  She also had previous bilateral vein ablation by Dr. Earna Coder.  She is not aware of any previous cardiac history and she is not diabetic.  She reports bilateral hip discomfort worse on the right side with some discomfort behind the right knee.  No chest pain or shortness of breath.  She does complain of frequent palpitations and tachycardia mostly at night.  Sometimes this is associated with numbness in both hands.  She is not aware of carotid disease.    Past Medical History:  Diagnosis Date  . Hypercholesterolemia   . Hypertension   . PAD (peripheral artery disease) (HCC)    Status post right iliac stent placement and angioplasty of the left iliac artery in 2016 by Dr. Jenness Corner in [place].    Past Surgical History:  Procedure Laterality Date  . ABDOMINAL HYSTERECTOMY    . ENDOVENOUS ABLATION SAPHENOUS VEIN W/ LASER Bilateral   . stent in r leg       Current Outpatient Medications  Medication Sig Dispense Refill  . albuterol (PROVENTIL HFA;VENTOLIN HFA) 108 (90 BASE) MCG/ACT inhaler Inhale into the lungs every 6 (six) hours as needed for wheezing or shortness of breath.    Marland Kitchen amLODipine (NORVASC) 5 MG tablet Take 5 mg by mouth 2 (two) times daily.    . Aspirin Buf,CaCarb-MgCarb-MgO, 81 MG TABS Take by mouth.    .  fenofibrate 160 MG tablet Take 160 mg by mouth daily.    . furosemide (LASIX) 20 MG tablet Take 20 mg by mouth.    . levocetirizine (XYZAL) 5 MG tablet Take 5 mg by mouth every evening.    . rosuvastatin (CRESTOR) 10 MG tablet Take 10 mg by mouth daily.     No current facility-administered medications for this visit.    Allergies:   Ace inhibitors, Ciprofloxacin, Ezetimibe, Fenofibrate, Hctz [hydrochlorothiazide], Losartan, and Sulfa antibiotics    Social History:  The patient  reports that she quit smoking about 19 years ago. She has never used smokeless tobacco. She reports current alcohol use. She reports that she does not use drugs.   Family History:  The patient's family history includes Colon cancer in her cousin, maternal aunt, paternal uncle, and paternal uncle.    ROS:  Please see the history of present illness.   Otherwise, review of systems are positive for none.   All other systems are reviewed and negative.    PHYSICAL EXAM: VS:  BP 140/76   Pulse 77   Ht 5\' 2"  (1.575 m)   Wt 140 lb (63.5 kg)   SpO2 99%   BMI 25.61 kg/m  , BMI Body mass index is 25.61 kg/m. GEN: Well nourished, well developed, in no acute distress  HEENT: normal  Neck: no JVD,  or masses.  Right carotid bruit Cardiac: RRR;  no murmurs, rubs, or gallops,no edema  Respiratory:  clear to auscultation bilaterally, normal work of breathing GI: soft, nontender, nondistended, + BS MS: no deformity or atrophy  Skin: warm and dry, no rash Neuro:  Strength and sensation are intact Psych: euthymic mood, full affect Vascular: Radial pulses +2 bilaterally.  Femoral: +1 on the right and +2 on the left.  Posterior tibial is +2 bilaterally.  Dorsalis pedis: 0 on the right and +2 on the left.   EKG:  EKG is ordered today. The ekg ordered today demonstrates sinus rhythm with possible old septal infarct.   Recent Labs: No results found for requested labs within last 8760 hours.    Lipid Panel No results  found for: CHOL, TRIG, HDL, CHOLHDL, VLDL, LDLCALC, LDLDIRECT    Wt Readings from Last 3 Encounters:  10/04/20 140 lb (63.5 kg)  05/12/19 134 lb (60.8 kg)  05/08/19 135 lb (61.2 kg)        No flowsheet data found.    ASSESSMENT AND PLAN:  1.  Peripheral arterial disease: Status post bilateral iliac artery endovascular intervention in 2016.  Currently with bilateral hip pain worse on the right side and we have to rule out restenosis especially with mildly diminished right femoral artery pulse.  I requested an ABI and aortoiliac duplex.  Continue treatment of risk factors.  Continue low-dose aspirin.  2.  Right carotid bruit: No previous evaluation.  I requested carotid Doppler.  3.  Palpitations: She reports almost palpitations every night with significant tachycardia.  I requested a 2-week ZIO monitor.  4.  Essential hypertension: Blood pressure is reasonably controlled.  5.  Hyperlipidemia: Currently on rosuvastatin and fenofibrate.  Recommend a target LDL of less than 70 and triglyceride of less than 150.    Disposition:   FU with me after testing.  Signed,  Lorine Bears, MD  10/04/2020 3:29 PM    [facility]

## 2020-10-04 NOTE — Progress Notes (Signed)
Patient ID: Carolyn Robinson, female   DOB: 1951-02-06, 70 y.o.   MRN: [id number] Patient enrolled for [employer] to ship a 14 day ZIO XT long term holter monitor to her home.

## 2020-10-04 NOTE — Patient Instructions (Signed)
Medication Instructions:  No Changes In Medications at this time.  *If you need a refill on your cardiac medications before your next appointment, please call your pharmacy*  Testing/Procedures: Your physician has requested that you have a carotid duplex. This test is an ultrasound of the carotid arteries in your neck. It looks at blood flow through these arteries that supply the brain with blood. Allow one hour for this exam. There are no restrictions or special instructions.  Your physician has requested that you have an Aorta/Iliac Duplex. This will be take place at [address] [facility], [address].    No food after 11PM the night before.  Water is OK. (Don't drink liquids if you have been instructed not to for ANOTHER test).  Take two Extra-Strength Gas-X capsules at bedtime the night before test.   Take an additional two Extra-Strength Gas-X capsules three (3) hours before the test or first thing in the morning.    Avoid foods that produce bowel gas, for 24 hours prior to exam (see below).    No breakfast, no chewing gum, no smoking or carbonated beverages.  Patient may take morning medications with water.  Come in for test at least 15 minutes early to register.   Your physician has requested that you have an ankle brachial index (ABI). During this test an ultrasound and blood pressure cuff are used to evaluate the arteries that supply the arms and legs with blood. Allow thirty minutes for this exam. There are no restrictions or special instructions.   ZIO XT- Long Term Monitor Instructions   Your physician has requested you wear your ZIO patch monitor 14 days.   This is a single patch monitor.  [employer] supplies one patch monitor per enrollment.  Additional stickers are not available.   Please do not apply patch if you will be having a Nuclear Stress Test, Echocardiogram, Cardiac CT, MRI, or Chest Xray during the time frame you would be wearing the monitor. The patch cannot be  worn during these tests.  You cannot remove and re-apply the ZIO XT patch monitor.   Your ZIO patch monitor will be sent USPS Priority mail from [facility] directly to your home address. The monitor may also be mailed to a PO BOX if home delivery is not available.   It may take 3-5 days to receive your monitor after you have been enrolled.   Once you have received you monitor, please review enclosed instructions.  Your monitor has already been registered assigning a specific monitor serial # to you.   Applying the monitor   Shave hair from upper left chest.   Hold abrader disc by orange tab.  Rub abrader in 40 strokes over left upper chest as indicated in your monitor instructions.   Clean area with 4 enclosed alcohol pads .  Use all pads to assure are is cleaned thoroughly.  Let dry.   Apply patch as indicated in monitor instructions.  Patch will be place under collarbone on left side of chest with arrow pointing upward.   Rub patch adhesive wings for 2 minutes.Remove white label marked "1".  Remove white label marked "2".  Rub patch adhesive wings for 2 additional minutes.   While looking in a mirror, press and release button in center of patch.  A small green light will flash 3-4 times .  This will be your only indicator the monitor has been turned on.     Do not shower for the first 24 hours.  You may shower after the first 24 hours.   Press button if you feel a symptom. You will hear a small click.  Record Date, Time and Symptom in the Patient Log Book.   When you are ready to remove patch, follow instructions on last 2 pages of Patient Log Book.  Stick patch monitor onto last page of Patient Log Book.   Place Patient Log Book in [place] box.  Use locking tab on box and tape box closed securely.  The [employer] has [employer] on it.  Please place in mailbox as soon as possible.  Your physician should have your test results approximately 7 days after the monitor  has been mailed back to [facility].   Call [facility] Customer Care at [phone] if you have questions regarding your ZIO XT patch monitor.  Call them immediately if you see an orange light blinking on your monitor.   If your monitor falls off in less than 4 days contact our Monitor department at [phone].  If your monitor becomes loose or falls off after 4 days call [employer] at [phone] for suggestions on securing your monitor.   Follow-Up: At [facility], you and your health needs are our priority.  As part of our continuing mission to provide you with exceptional heart care, we have created designated Provider Care Teams.  These Care Teams include your primary Cardiologist (physician) and Advanced Practice Providers (APPs -  Physician Assistants and Nurse Practitioners) who all work together to provide you with the care you need, when you need it.  We recommend signing up for the patient portal called "MyChart".  Sign up information is provided on this After Visit Summary.  MyChart is used to connect with patients for Virtual Visits (Telemedicine).  Patients are able to view lab/test results, encounter notes, upcoming appointments, etc.  Non-urgent messages can be sent to your provider as well.   To learn more about what you can do with MyChart, go to [email or web address].    Your next appointment:   AFTER TESTING   The format for your next appointment:   In Person  Provider:   Lorine Bears, MD

## 2020-10-09 DIAGNOSIS — R002 Palpitations: Secondary | ICD-10-CM | POA: Diagnosis not present

## 2020-10-21 ENCOUNTER — Ambulatory Visit (HOSPITAL_COMMUNITY)
Admission: RE | Admit: 2020-10-21 | Discharge: 2020-10-21 | Disposition: A | Discharge status: Home / Self Care | Payer: Medicare HMO | Source: Ambulatory Visit | Attending: Cardiovascular Disease | Admitting: Cardiovascular Disease

## 2020-10-21 ENCOUNTER — Other Ambulatory Visit (HOSPITAL_COMMUNITY): Payer: Self-pay | Admitting: Cardiovascular Disease

## 2020-10-21 ENCOUNTER — Ambulatory Visit (HOSPITAL_BASED_OUTPATIENT_CLINIC_OR_DEPARTMENT_OTHER)
Admission: RE | Admit: 2020-10-21 | Discharge: 2020-10-21 | Disposition: A | Discharge status: Home / Self Care | Payer: Medicare HMO | Source: Ambulatory Visit | Attending: Cardiovascular Disease | Admitting: Cardiovascular Disease

## 2020-10-21 ENCOUNTER — Other Ambulatory Visit: Payer: Self-pay

## 2020-10-21 DIAGNOSIS — I739 Peripheral vascular disease, unspecified: Secondary | ICD-10-CM

## 2020-10-21 DIAGNOSIS — I6523 Occlusion and stenosis of bilateral carotid arteries: Secondary | ICD-10-CM

## 2020-10-21 DIAGNOSIS — M79605 Pain in left leg: Secondary | ICD-10-CM | POA: Diagnosis not present

## 2020-10-21 DIAGNOSIS — R0989 Other specified symptoms and signs involving the circulatory and respiratory systems: Secondary | ICD-10-CM

## 2020-10-21 DIAGNOSIS — M79604 Pain in right leg: Secondary | ICD-10-CM

## 2020-10-21 DIAGNOSIS — Z9862 Peripheral vascular angioplasty status: Secondary | ICD-10-CM

## 2020-10-21 DIAGNOSIS — Z95828 Presence of other vascular implants and grafts: Secondary | ICD-10-CM

## 2020-11-08 ENCOUNTER — Other Ambulatory Visit: Payer: Self-pay

## 2020-11-08 ENCOUNTER — Encounter: Payer: Self-pay | Admitting: Cardiovascular Disease

## 2020-11-08 ENCOUNTER — Ambulatory Visit (INDEPENDENT_AMBULATORY_CARE_PROVIDER_SITE_OTHER): Payer: Medicare HMO | Admitting: Cardiovascular Disease

## 2020-11-08 VITALS — BP 132/78 | HR 82 | Ht 62.0 in | Wt 136.0 lb

## 2020-11-08 DIAGNOSIS — R002 Palpitations: Secondary | ICD-10-CM | POA: Diagnosis not present

## 2020-11-08 DIAGNOSIS — I6521 Occlusion and stenosis of right carotid artery: Secondary | ICD-10-CM | POA: Diagnosis not present

## 2020-11-08 DIAGNOSIS — I10 Essential (primary) hypertension: Secondary | ICD-10-CM | POA: Diagnosis not present

## 2020-11-08 DIAGNOSIS — I739 Peripheral vascular disease, unspecified: Secondary | ICD-10-CM | POA: Diagnosis not present

## 2020-11-08 NOTE — Patient Instructions (Signed)

## 2020-11-08 NOTE — Progress Notes (Signed)
Cardiology Office Note   Date:  11/08/2020   ID:  Carolyn Robinson, DOB Oct 06, 1950, MRN [id number]  PCP:  Carolyn Abrahams, MD  Cardiologist:   Carolyn Bears, MD   No chief complaint on file.     History of Present Illness: Carolyn Robinson is a 70 y.o. female who is here today for follow-up visit regarding peripheral arterial disease and palpitations.  The patient has history of essential hypertension, hyperlipidemia, previous tobacco use and peripheral arterial disease. She is status post right iliac artery stent placement (8 x 57 mm VISI Pro stent) and left iliac artery atherectomy?  and balloon angioplasty done by Dr. Jenness Robinson in 2016 in [place] for severe claudication.  She also had previous bilateral vein ablation by Dr. Earna Robinson.  She is not aware of any previous cardiac history and she is not diabetic.  She reports bilateral hip discomfort worse on the right side.  She also complained of intermittent palpitations and tachycardia during last visit.  She was found to have right carotid Doppler. She underwent a 2-week ZIO monitor which showed normal sinus rhythm with an average heart rate of 91 bpm.  She had 3 SVT runs the longest lasted 17 seconds.  I suggested small dose beta-blocker but she felt that her symptoms were not severe enough to justify medication. Carotid Doppler showed moderate right carotid stenosis.  Lower extremity arterial Doppler showed normal ABI and TBI bilaterally.  Duplex showed patent iliac stent with no restenosis.  There was moderate bulky calcified stenosis in the right common femoral artery with velocities of 300 cm/s.  Past Medical History:  Diagnosis Date  . Hypercholesterolemia   . Hypertension   . PAD (peripheral artery disease) (HCC)    Status post right iliac stent placement and angioplasty of the left iliac artery in 2016 by Dr. Jenness Robinson in [place].    Past Surgical History:  Procedure Laterality Date  . ABDOMINAL HYSTERECTOMY    . ENDOVENOUS  ABLATION SAPHENOUS VEIN W/ LASER Bilateral   . stent in r leg       Current Outpatient Medications  Medication Sig Dispense Refill  . albuterol (PROVENTIL HFA;VENTOLIN HFA) 108 (90 BASE) MCG/ACT inhaler Inhale into the lungs every 6 (six) hours as needed for wheezing or shortness of breath.    Marland Kitchen amLODipine (NORVASC) 5 MG tablet Take 5 mg by mouth 2 (two) times daily.    . Aspirin Buf,CaCarb-MgCarb-MgO, 81 MG TABS Take by mouth.    . fenofibrate 160 MG tablet Take 160 mg by mouth daily.    . furosemide (LASIX) 20 MG tablet Take 20 mg by mouth.    . levocetirizine (XYZAL) 5 MG tablet Take 5 mg by mouth every evening.    . rosuvastatin (CRESTOR) 10 MG tablet Take 10 mg by mouth daily.     No current facility-administered medications for this visit.    Allergies:   Ace inhibitors, Ciprofloxacin, Ezetimibe, Hctz [hydrochlorothiazide], Losartan, and Sulfa antibiotics    Social History:  The patient  reports that she quit smoking about 19 years ago. She has never used smokeless tobacco. She reports current alcohol use. She reports that she does not use drugs.   Family History:  The patient's family history includes Colon cancer in her cousin, maternal aunt, paternal uncle, and paternal uncle.    ROS:  Please see the history of present illness.   Otherwise, review of systems are positive for none.   All other systems are reviewed and negative.  PHYSICAL EXAM: VS:  BP 132/78   Pulse 82   Ht 5\' 2"  (1.575 m)   Wt 136 lb (61.7 kg)   SpO2 97%   BMI 24.87 kg/m  , BMI Body mass index is 24.87 kg/m. GEN: Well nourished, well developed, in no acute distress  HEENT: normal  Neck: no JVD,  or masses.  Right carotid bruit Cardiac: RRR; no murmurs, rubs, or gallops,no edema  Respiratory:  clear to auscultation bilaterally, normal work of breathing GI: soft, nontender, nondistended, + BS MS: no deformity or atrophy  Skin: warm and dry, no rash Neuro:  Strength and sensation are  intact Psych: euthymic mood, full affect Vascular: Radial pulses +2 bilaterally.  Femoral: +1 on the right and +2 on the left.  Posterior tibial is +2 bilaterally.  Dorsalis pedis: 0 on the right and +2 on the left.   EKG:  EKG is not ordered today.    Recent Labs: No results found for requested labs within last 8760 hours.    Lipid Panel No results found for: CHOL, TRIG, HDL, CHOLHDL, VLDL, LDLCALC, LDLDIRECT    Wt Readings from Last 3 Encounters:  11/08/20 136 lb (61.7 kg)  10/04/20 140 lb (63.5 kg)  05/12/19 134 lb (60.8 kg)        No flowsheet data found.    ASSESSMENT AND PLAN:  1.  Peripheral arterial disease: Status post bilateral iliac artery endovascular intervention in 2016.  Recent duplex showed patent iliac stents.  She does have moderate right common femoral artery stenosis and seems to have mild right calf claudication.  However, symptoms are currently not lifestyle limiting.  Recommend continuing medical therapy and repeat vascular testing in 1 year.    Continue treatment of risk factors.  Continue low-dose aspirin.  2.  Moderate right carotid stenosis: Continue low-dose aspirin and repeat carotid Doppler in 1 year.  3.  Palpitations: Likely due to short runs of SVT.  Symptoms are not severe enough to add a medication.  4.  Essential hypertension: Blood pressure is reasonably controlled.  5.  Hyperlipidemia: Currently on rosuvastatin and fenofibrate.  Recommend a target LDL of less than 70 and triglyceride of less than 150.    Disposition:   FU with me in 6 months.  Signed,  2017, MD  11/08/2020 11:35 AM    [facility]

## 2021-04-10 DIAGNOSIS — E782 Mixed hyperlipidemia: Secondary | ICD-10-CM | POA: Diagnosis not present

## 2021-04-10 DIAGNOSIS — F1020 Alcohol dependence, uncomplicated: Secondary | ICD-10-CM | POA: Diagnosis not present

## 2021-04-10 DIAGNOSIS — I10 Essential (primary) hypertension: Secondary | ICD-10-CM | POA: Diagnosis not present

## 2021-04-10 DIAGNOSIS — R748 Abnormal levels of other serum enzymes: Secondary | ICD-10-CM | POA: Diagnosis not present

## 2021-05-09 ENCOUNTER — Ambulatory Visit: Payer: Medicare HMO | Admitting: Cardiovascular Disease

## 2021-06-20 ENCOUNTER — Ambulatory Visit (INDEPENDENT_AMBULATORY_CARE_PROVIDER_SITE_OTHER): Payer: Medicare HMO | Admitting: Cardiovascular Disease

## 2021-06-20 ENCOUNTER — Encounter: Payer: Self-pay | Admitting: Cardiovascular Disease

## 2021-06-20 ENCOUNTER — Other Ambulatory Visit: Payer: Self-pay

## 2021-06-20 VITALS — BP 136/76 | HR 71 | Ht 62.0 in | Wt 141.0 lb

## 2021-06-20 DIAGNOSIS — I6521 Occlusion and stenosis of right carotid artery: Secondary | ICD-10-CM

## 2021-06-20 DIAGNOSIS — I10 Essential (primary) hypertension: Secondary | ICD-10-CM | POA: Diagnosis not present

## 2021-06-20 DIAGNOSIS — E782 Mixed hyperlipidemia: Secondary | ICD-10-CM

## 2021-06-20 DIAGNOSIS — R002 Palpitations: Secondary | ICD-10-CM | POA: Diagnosis not present

## 2021-06-20 DIAGNOSIS — I739 Peripheral vascular disease, unspecified: Secondary | ICD-10-CM | POA: Diagnosis not present

## 2021-06-20 NOTE — Patient Instructions (Signed)
Medication Instructions:  Your physician recommends that you continue on your current medications as directed. Please refer to the Current Medication list given to you today.  *If you need a refill on your cardiac medications before your next appointment, please call your pharmacy*  Lab Work:  If you have labs (blood work) drawn today and your tests are completely normal, you will receive your results only by: MyChart Message (if you have MyChart) OR A paper copy in the mail If you have any lab test that is abnormal or we need to change your treatment, we will call you to review the results.  Testing/Procedures: NONE ordered at this time of appointment   Follow-Up: At [facility], you and your health needs are our priority.  As part of our continuing mission to provide you with exceptional heart care, we have created designated Provider Care Teams.  These Care Teams include your primary Cardiologist (physician) and Advanced Practice Providers (APPs -  Physician Assistants and Nurse Practitioners) who all work together to provide you with the care you need, when you need it.  Your next appointment:   1 year(s)  The format for your next appointment:   In Person  Provider:   Lorine Bears. MD   Other Instructions

## 2021-06-20 NOTE — Progress Notes (Signed)
Cardiology Office Note   Date:  06/20/2021   ID:  Carolyn Robinson, DOB 11-09-1950, MRN [id number]  PCP:  Eldridge Abrahams, MD  Cardiologist:   Lorine Bears, MD   Chief Complaint  Patient presents with   Follow-up    6 months.       History of Present Illness: Carolyn Robinson is a 70 y.o. female who is here today for follow-up visit regarding peripheral arterial disease and palpitations.  The patient has history of essential hypertension, hyperlipidemia, previous tobacco use and peripheral arterial disease. She is status post right iliac artery stent placement (8 x 57 mm VISI Pro stent) and left iliac artery atherectomy?  and balloon angioplasty done by Dr. Jenness Corner in 2016 in [place] for severe claudication.  She also had previous bilateral vein ablation by Dr. Earna Coder.  She is not aware of any previous cardiac history and she is not diabetic.  She had a ZIO monitor for palpitation this year that showed normal sinus rhythm with an average heart rate of 91 bpm.  She had 3 SVT runs the longest lasted 17 seconds.  I suggested small dose beta-blocker but she felt that her symptoms were not severe enough to justify medication.  Carotid Doppler showed moderate right carotid stenosis.  Lower extremity arterial Doppler showed normal ABI and TBI bilaterally.  Duplex showed patent iliac stent with no restenosis.  There was moderate bulky calcified stenosis in the right common femoral artery with velocities of 300 cm/s.  She has been doing reasonably well and denies chest pain, shortness of breath or worsening palpitations.  She has minimal leg claudication at the present time.  Past Medical History:  Diagnosis Date   Hypercholesterolemia    Hypertension    PAD (peripheral artery disease) (HCC)    Status post right iliac stent placement and angioplasty of the left iliac artery in 2016 by Dr. Jenness Corner in [place].    Past Surgical History:  Procedure Laterality Date   ABDOMINAL  HYSTERECTOMY     ENDOVENOUS ABLATION SAPHENOUS VEIN W/ LASER Bilateral    stent in r leg       Current Outpatient Medications  Medication Sig Dispense Refill   albuterol (PROVENTIL HFA;VENTOLIN HFA) 108 (90 BASE) MCG/ACT inhaler Inhale into the lungs every 6 (six) hours as needed for wheezing or shortness of breath.     amLODipine (NORVASC) 5 MG tablet Take 5 mg by mouth 2 (two) times daily.     Aspirin Buf,CaCarb-MgCarb-MgO, 81 MG TABS Take by mouth.     fenofibrate 160 MG tablet Take 160 mg by mouth daily.     furosemide (LASIX) 20 MG tablet Take 20 mg by mouth.     levocetirizine (XYZAL) 5 MG tablet Take 5 mg by mouth every evening.     rosuvastatin (CRESTOR) 10 MG tablet Take 10 mg by mouth daily.     No current facility-administered medications for this visit.    Allergies:   Ace inhibitors, Ciprofloxacin, Ezetimibe, Hctz [hydrochlorothiazide], Losartan, and Sulfa antibiotics    Social History:  The patient  reports that she quit smoking about 19 years ago. She has never used smokeless tobacco. She reports current alcohol use. She reports that she does not use drugs.   Family History:  The patient's family history includes Colon cancer in her cousin, maternal aunt, paternal uncle, and paternal uncle.    ROS:  Please see the history of present illness.   Otherwise, review of systems are positive for none.  All other systems are reviewed and negative.    PHYSICAL EXAM: VS:  BP 136/76 (BP Location: Left Arm, Patient Position: Sitting, Cuff Size: Normal)   Pulse 71   Ht 5\' 2"  (1.575 m)   Wt 141 lb (64 kg)   BMI 25.79 kg/m  , BMI Body mass index is 25.79 kg/m. GEN: Well nourished, well developed, in no acute distress  HEENT: normal  Neck: no JVD,  or masses.  Right carotid bruit Cardiac: RRR; no murmurs, rubs, or gallops,no edema  Respiratory:  clear to auscultation bilaterally, normal work of breathing GI: soft, nontender, nondistended, + BS MS: no deformity or atrophy   Skin: warm and dry, no rash Neuro:  Strength and sensation are intact Psych: euthymic mood, full affect Vascular: Radial pulses +2 bilaterally.  Femoral: +1 on the right and +2 on the left.  Posterior tibial is +2 bilaterally.  Dorsalis pedis: 0 on the right and +2 on the left.   EKG:  EKG is not ordered today.    Recent Labs: No results found for requested labs within last 8760 hours.    Lipid Panel No results found for: CHOL, TRIG, HDL, CHOLHDL, VLDL, LDLCALC, LDLDIRECT    Wt Readings from Last 3 Encounters:  06/20/21 141 lb (64 kg)  11/08/20 136 lb (61.7 kg)  10/04/20 140 lb (63.5 kg)        No flowsheet data found.    ASSESSMENT AND PLAN:  1.  Peripheral arterial disease: Status post bilateral iliac artery endovascular intervention in 2016.  Recent duplex showed patent iliac stents.  She does have moderate right common femoral artery stenosis .  However, right calf claudication is mild at the present time and symptoms are not lifestyle limiting.  Continue medical therapy and low-dose aspirin.  I requested repeat ABI and aortoiliac duplex to be done in April.    2.  Moderate right carotid stenosis: Continue low-dose aspirin.  I requested repeat carotid Doppler to be done in April 2023.  3.  Palpitations: Likely due to short runs of SVT.  Symptoms are not severe enough to add a medication.  She reports that her symptoms are overall stable.  4.  Essential hypertension: Blood pressure is reasonably controlled.  5.  Hyperlipidemia: Currently on rosuvastatin and fenofibrate.  Recommend a target LDL of less than 70 .  Fibrates do not have improved cardiovascular outcomes data.  Unless her triglyceride is severely elevated, it might make more sense to intensify her statin therapy.  I do not have results of her lipid profile.    Disposition:   FU with me in 6 months.  Signed,  May 2023, MD  06/20/2021 1:15 PM    [facility]

## 2021-06-26 DIAGNOSIS — J309 Allergic rhinitis, unspecified: Secondary | ICD-10-CM | POA: Diagnosis not present

## 2021-10-17 DIAGNOSIS — J302 Other seasonal allergic rhinitis: Secondary | ICD-10-CM | POA: Diagnosis not present

## 2021-10-17 DIAGNOSIS — M109 Gout, unspecified: Secondary | ICD-10-CM | POA: Diagnosis not present

## 2021-10-17 DIAGNOSIS — E782 Mixed hyperlipidemia: Secondary | ICD-10-CM | POA: Diagnosis not present

## 2021-10-17 DIAGNOSIS — D126 Benign neoplasm of colon, unspecified: Secondary | ICD-10-CM | POA: Diagnosis not present

## 2021-10-17 DIAGNOSIS — Z6824 Body mass index (BMI) 24.0-24.9, adult: Secondary | ICD-10-CM | POA: Diagnosis not present

## 2021-10-17 DIAGNOSIS — D45 Polycythemia vera: Secondary | ICD-10-CM | POA: Diagnosis not present

## 2021-10-17 DIAGNOSIS — M8580 Other specified disorders of bone density and structure, unspecified site: Secondary | ICD-10-CM | POA: Diagnosis not present

## 2021-10-17 DIAGNOSIS — E559 Vitamin D deficiency, unspecified: Secondary | ICD-10-CM | POA: Diagnosis not present

## 2021-10-17 DIAGNOSIS — I10 Essential (primary) hypertension: Secondary | ICD-10-CM | POA: Diagnosis not present

## 2021-10-17 DIAGNOSIS — Z7185 Encounter for immunization safety counseling: Secondary | ICD-10-CM | POA: Diagnosis not present

## 2021-10-17 DIAGNOSIS — R739 Hyperglycemia, unspecified: Secondary | ICD-10-CM | POA: Diagnosis not present

## 2021-10-17 DIAGNOSIS — I739 Peripheral vascular disease, unspecified: Secondary | ICD-10-CM | POA: Diagnosis not present

## 2021-10-17 DIAGNOSIS — Z719 Counseling, unspecified: Secondary | ICD-10-CM | POA: Diagnosis not present

## 2021-10-17 DIAGNOSIS — Z0001 Encounter for general adult medical examination with abnormal findings: Secondary | ICD-10-CM | POA: Diagnosis not present

## 2021-10-24 ENCOUNTER — Ambulatory Visit (HOSPITAL_BASED_OUTPATIENT_CLINIC_OR_DEPARTMENT_OTHER)
Admission: RE | Admit: 2021-10-24 | Discharge: 2021-10-24 | Disposition: A | Discharge status: Home / Self Care | Payer: Medicare HMO | Source: Ambulatory Visit | Attending: Cardiovascular Disease | Admitting: Cardiovascular Disease

## 2021-10-24 ENCOUNTER — Ambulatory Visit (HOSPITAL_COMMUNITY)
Admission: RE | Admit: 2021-10-24 | Discharge: 2021-10-24 | Disposition: A | Discharge status: Home / Self Care | Payer: Medicare HMO | Source: Ambulatory Visit | Attending: Internal Medicine | Admitting: Internal Medicine

## 2021-10-24 DIAGNOSIS — Z95820 Peripheral vascular angioplasty status with implants and grafts: Secondary | ICD-10-CM

## 2021-10-24 DIAGNOSIS — I739 Peripheral vascular disease, unspecified: Secondary | ICD-10-CM

## 2021-10-24 DIAGNOSIS — I6521 Occlusion and stenosis of right carotid artery: Secondary | ICD-10-CM | POA: Diagnosis not present

## 2021-10-27 ENCOUNTER — Telehealth: Payer: Self-pay | Admitting: Cardiovascular Disease

## 2021-10-27 NOTE — Telephone Encounter (Signed)
Left message to call back  

## 2021-10-27 NOTE — Telephone Encounter (Signed)
Patient is calling for her test results.

## 2021-10-30 ENCOUNTER — Other Ambulatory Visit: Payer: Self-pay | Admitting: *Deleted

## 2021-10-30 DIAGNOSIS — I739 Peripheral vascular disease, unspecified: Secondary | ICD-10-CM

## 2021-11-07 DIAGNOSIS — J449 Chronic obstructive pulmonary disease, unspecified: Secondary | ICD-10-CM | POA: Diagnosis not present

## 2021-11-07 DIAGNOSIS — Z1231 Encounter for screening mammogram for malignant neoplasm of breast: Secondary | ICD-10-CM | POA: Diagnosis not present

## 2022-01-25 DIAGNOSIS — H04223 Epiphora due to insufficient drainage, bilateral lacrimal glands: Secondary | ICD-10-CM | POA: Diagnosis not present

## 2022-01-25 DIAGNOSIS — H35361 Drusen (degenerative) of macula, right eye: Secondary | ICD-10-CM | POA: Diagnosis not present

## 2022-04-26 DIAGNOSIS — E559 Vitamin D deficiency, unspecified: Secondary | ICD-10-CM | POA: Diagnosis not present

## 2022-04-26 DIAGNOSIS — D45 Polycythemia vera: Secondary | ICD-10-CM | POA: Diagnosis not present

## 2022-04-26 DIAGNOSIS — M8580 Other specified disorders of bone density and structure, unspecified site: Secondary | ICD-10-CM | POA: Diagnosis not present

## 2022-04-26 DIAGNOSIS — J449 Chronic obstructive pulmonary disease, unspecified: Secondary | ICD-10-CM | POA: Diagnosis not present

## 2022-04-26 DIAGNOSIS — Z6824 Body mass index (BMI) 24.0-24.9, adult: Secondary | ICD-10-CM | POA: Diagnosis not present

## 2022-04-26 DIAGNOSIS — Z79899 Other long term (current) drug therapy: Secondary | ICD-10-CM | POA: Diagnosis not present

## 2022-04-26 DIAGNOSIS — G4700 Insomnia, unspecified: Secondary | ICD-10-CM | POA: Diagnosis not present

## 2022-04-26 DIAGNOSIS — I10 Essential (primary) hypertension: Secondary | ICD-10-CM | POA: Diagnosis not present

## 2022-04-26 DIAGNOSIS — M109 Gout, unspecified: Secondary | ICD-10-CM | POA: Diagnosis not present

## 2022-04-26 DIAGNOSIS — E782 Mixed hyperlipidemia: Secondary | ICD-10-CM | POA: Diagnosis not present

## 2022-04-26 DIAGNOSIS — I739 Peripheral vascular disease, unspecified: Secondary | ICD-10-CM | POA: Diagnosis not present

## 2022-05-29 DIAGNOSIS — H02135 Senile ectropion of left lower eyelid: Secondary | ICD-10-CM | POA: Diagnosis not present

## 2022-05-29 DIAGNOSIS — H02132 Senile ectropion of right lower eyelid: Secondary | ICD-10-CM | POA: Diagnosis not present

## 2022-05-29 DIAGNOSIS — H04203 Unspecified epiphora, bilateral lacrimal glands: Secondary | ICD-10-CM | POA: Diagnosis not present

## 2022-06-04 DIAGNOSIS — M79672 Pain in left foot: Secondary | ICD-10-CM | POA: Diagnosis not present

## 2022-06-04 DIAGNOSIS — S92902A Unspecified fracture of left foot, initial encounter for closed fracture: Secondary | ICD-10-CM | POA: Diagnosis not present

## 2022-06-11 DIAGNOSIS — S92355A Nondisplaced fracture of fifth metatarsal bone, left foot, initial encounter for closed fracture: Secondary | ICD-10-CM | POA: Diagnosis not present

## 2022-06-25 DIAGNOSIS — S92355D Nondisplaced fracture of fifth metatarsal bone, left foot, subsequent encounter for fracture with routine healing: Secondary | ICD-10-CM | POA: Diagnosis not present

## 2022-07-03 ENCOUNTER — Ambulatory Visit: Payer: Medicare HMO | Admitting: Cardiovascular Disease

## 2022-07-23 DIAGNOSIS — H9313 Tinnitus, bilateral: Secondary | ICD-10-CM | POA: Diagnosis not present

## 2022-07-23 DIAGNOSIS — J309 Allergic rhinitis, unspecified: Secondary | ICD-10-CM | POA: Diagnosis not present

## 2022-08-08 DIAGNOSIS — H02132 Senile ectropion of right lower eyelid: Secondary | ICD-10-CM | POA: Diagnosis not present

## 2022-08-08 DIAGNOSIS — H02135 Senile ectropion of left lower eyelid: Secondary | ICD-10-CM | POA: Diagnosis not present

## 2022-08-08 DIAGNOSIS — H02102 Unspecified ectropion of right lower eyelid: Secondary | ICD-10-CM | POA: Diagnosis not present

## 2022-08-21 ENCOUNTER — Ambulatory Visit: Payer: Medicare HMO | Admitting: Cardiovascular Disease

## 2022-09-25 ENCOUNTER — Ambulatory Visit: Payer: Medicare HMO | Attending: Cardiovascular Disease | Admitting: Cardiovascular Disease

## 2022-09-25 ENCOUNTER — Encounter: Payer: Self-pay | Admitting: Cardiovascular Disease

## 2022-09-25 VITALS — BP 130/70 | HR 77 | Ht 62.0 in | Wt 134.0 lb

## 2022-09-25 DIAGNOSIS — E785 Hyperlipidemia, unspecified: Secondary | ICD-10-CM

## 2022-09-25 DIAGNOSIS — I6521 Occlusion and stenosis of right carotid artery: Secondary | ICD-10-CM

## 2022-09-25 DIAGNOSIS — I10 Essential (primary) hypertension: Secondary | ICD-10-CM

## 2022-09-25 DIAGNOSIS — R0602 Shortness of breath: Secondary | ICD-10-CM

## 2022-09-25 DIAGNOSIS — R002 Palpitations: Secondary | ICD-10-CM | POA: Diagnosis not present

## 2022-09-25 DIAGNOSIS — I739 Peripheral vascular disease, unspecified: Secondary | ICD-10-CM

## 2022-09-25 MED ORDER — METOPROLOL SUCCINATE ER 25 MG PO TB24: 25.0000 mg | ORAL_TABLET | Freq: Every day | ORAL | 3 refills | Status: DC

## 2022-09-25 NOTE — Patient Instructions (Signed)
Medication Instructions:  START Metoprolol 25 mg once daily  *If you need a refill on your cardiac medications before your next appointment, please call your pharmacy*   Lab Work: None ordered If you have labs (blood work) drawn today and your tests are completely normal, you will receive your results only by: [place] (if you have MyChart) OR A paper copy in the mail If you have any lab test that is abnormal or we need to change your treatment, we will call you to review the results.   Testing/Procedures: Your physician has requested that you have an echocardiogram. Echocardiography is a painless test that uses sound waves to create images of your heart. It provides your doctor with information about the size and shape of your heart and how well your heart's chambers and valves are working. You may receive an ultrasound enhancing agent through an IV if needed to better visualize your heart during the echo.This procedure takes approximately one hour. There are no restrictions for this procedure. This will take place at the [address], [address].   Your physician has requested that you have an Aorta/Iliac Duplex. This will be take place at [address], [address].  No food after 11PM the night before.  Water is OK. (Don't drink liquids if you have been instructed not to for ANOTHER test) Avoid foods that produce bowel gas, for 24 hours prior to exam (see below). No breakfast, no chewing gum, no smoking or carbonated beverages. Patient may take morning medications with water. Come in for test at least 15 minutes early to register.  Your physician has requested that you have an ankle brachial index (ABI). During this test an ultrasound and blood pressure cuff are used to evaluate the arteries that supply the arms and legs with blood. Allow thirty minutes for this exam. There are no restrictions or special instructions. This will take place at [place], [address].    Follow-Up: At [facility], you and your health needs are our priority.  As part of our continuing mission to provide you with exceptional heart care, we have created designated Provider Care Teams.  These Care Teams include your primary Cardiologist (physician) and Advanced Practice Providers (APPs -  Physician Assistants and Nurse Practitioners) who all work together to provide you with the care you need, when you need it.  We recommend signing up for the patient portal called "MyChart".  Sign up information is provided on this After Visit Summary.  MyChart is used to connect with patients for Virtual Visits (Telemedicine).  Patients are able to view lab/test results, encounter notes, upcoming appointments, etc.  Non-urgent messages can be sent to your provider as well.   To learn more about what you can do with MyChart, go to [email or web address].    Your next appointment:   6 month(s)  Provider:   Dr. Fletcher Anon

## 2022-09-25 NOTE — Progress Notes (Signed)
Cardiology Office Note   Date:  09/25/2022   ID:  Carolyn Robinson, DOB [birth year], MRN [id number]  PCP:  Clinton Quant, MD  Cardiologist:   Kathlyn Sacramento, MD   Chief Complaint  Patient presents with   Follow-up    12 months.   Tachycardia   Edema    Lower legs but not everyday.       History of Present Illness: Carolyn Robinson is a 72 y.o. female who is here today for follow-up visit regarding peripheral arterial disease and palpitations. The patient has history of essential hypertension, hyperlipidemia, previous tobacco use and peripheral arterial disease. She is status post right iliac artery stent placement (8 x 57 mm VISI Pro stent) and left iliac artery atherectomy?  and balloon angioplasty done by Dr. Alvina Filbert in 2016 in [place] for severe claudication.  She also had previous bilateral vein ablation by Dr. Alroy Dust.  She is not aware of any previous cardiac history and she is not diabetic.  She had a ZIO monitor for palpitation in 2022  that showed normal sinus rhythm with an average heart rate of 91 bpm.  She had 3 SVT runs the longest lasted 17 seconds.  I suggested small dose beta-blocker but she felt that her symptoms were not severe enough to justify medication.  Carotid Doppler showed moderate right carotid stenosis.  Lower extremity arterial Doppler showed normal ABI and TBI bilaterally.  Duplex showed patent iliac stent with no restenosis.  There was moderate bulky calcified stenosis in the right common femoral artery with velocities of 300 cm/s.  She reports that her palpitations are becoming more frequent especially over the last few weeks with associated dizziness but no syncope.  She also reports some shortness of breath with no chest pain.  She had increased lower extremity edema especially at the end of the day.  She also has been concerned about discomfort in both legs mostly at rest that does not get worse with exertion.  Past Medical History:  Diagnosis Date    Hypercholesterolemia    Hypertension    PAD (peripheral artery disease) ([place])    Status post right iliac stent placement and angioplasty of the left iliac artery in 2016 by Dr. Alvina Filbert in [place].    Past Surgical History:  Procedure Laterality Date   ABDOMINAL HYSTERECTOMY     ENDOVENOUS ABLATION SAPHENOUS VEIN W/ LASER Bilateral    stent in r leg       Current Outpatient Medications  Medication Sig Dispense Refill   albuterol (PROVENTIL HFA;VENTOLIN HFA) 108 (90 BASE) MCG/ACT inhaler Inhale into the lungs every 6 (six) hours as needed for wheezing or shortness of breath.     amLODipine (NORVASC) 5 MG tablet Take 5 mg by mouth 2 (two) times daily.     Aspirin Buf,CaCarb-MgCarb-MgO, 81 MG TABS Take by mouth.     fenofibrate 160 MG tablet Take 160 mg by mouth daily.     furosemide (LASIX) 20 MG tablet Take 20 mg by mouth.     levocetirizine (XYZAL) 5 MG tablet Take 5 mg by mouth every evening.     rosuvastatin (CRESTOR) 10 MG tablet Take 10 mg by mouth daily.     No current facility-administered medications for this visit.    Allergies:   Ace inhibitors, Ciprofloxacin, Ezetimibe, Hctz [hydrochlorothiazide], Losartan, and Sulfa antibiotics    Social History:  The patient  reports that she quit smoking about 21 years ago. She has never used smokeless tobacco. She  reports current alcohol use. She reports that she does not use drugs.   Family History:  The patient's family history includes Colon cancer in her cousin, maternal aunt, paternal uncle, and paternal uncle.    ROS:  Please see the history of present illness.   Otherwise, review of systems are positive for none.   All other systems are reviewed and negative.    PHYSICAL EXAM: VS:  BP 130/70 (BP Location: Left Arm, Patient Position: Sitting, Cuff Size: Normal)   Pulse 77   Ht '5\' 2"'$  (1.575 m)   Wt 134 lb (60.8 kg)   BMI 24.51 kg/m  , BMI Body mass index is 24.51 kg/m. GEN: Well nourished, well developed, in no acute  distress  HEENT: normal  Neck: no JVD,  or masses.  Right carotid bruit Cardiac: RRR; no murmurs, rubs, or gallops,no edema  Respiratory:  clear to auscultation bilaterally, normal work of breathing GI: soft, nontender, nondistended, + BS MS: no deformity or atrophy  Skin: warm and dry, no rash Neuro:  Strength and sensation are intact Psych: euthymic mood, full affect Vascular: Radial pulses +2 bilaterally.  Femoral: +1 on the right and +2 on the left.  Distal pulses are palpable.   EKG:  EKG is ordered today. EKG showed normal sinus rhythm with low voltage and possible old septal infarct.   Recent Labs: No results found for requested labs within last 365 days.    Lipid Panel No results found for: "CHOL", "TRIG", "HDL", "CHOLHDL", "VLDL", "LDLCALC", "LDLDIRECT"    Wt Readings from Last 3 Encounters:  09/25/22 134 lb (60.8 kg)  06/20/21 141 lb (64 kg)  11/08/20 136 lb (61.7 kg)            No data to display            ASSESSMENT AND PLAN:  1.  Peripheral arterial disease: Status post bilateral iliac artery endovascular intervention in 2016.  Most recent duplex showed patent iliac stents.  She does have moderate right common femoral artery stenosis .  She has some typical discomfort in both legs that does not seem to be consistent with claudication.  I requested a follow-up Doppler studies.  2.  Moderate right carotid stenosis: Most recent carotid Doppler in April 2023 showed mild nonobstructive disease bilaterally.  3.  Palpitations: Previous outpatient monitor in 2022 showed short runs of SVT.  Given increased palpitations, I added Toprol 25 mg once daily.  4.  Essential hypertension: Blood pressure is well controlled.  5.  Hyperlipidemia: Currently on rosuvastatin and fenofibrate.  6.  Shortness of breath: No prior history of heart failure.  Requested an echocardiogram.  I suspect that her lower extremity edema is likely due to chronic venous  insufficiency.    Disposition:   FU with me in 6 months.  Signed,  Kathlyn Sacramento, MD  09/25/2022 11:32 AM    [place]

## 2022-10-12 ENCOUNTER — Encounter (HOSPITAL_COMMUNITY): Payer: Medicare HMO

## 2022-10-19 ENCOUNTER — Other Ambulatory Visit (HOSPITAL_COMMUNITY): Payer: Medicare HMO

## 2022-10-24 ENCOUNTER — Ambulatory Visit (HOSPITAL_BASED_OUTPATIENT_CLINIC_OR_DEPARTMENT_OTHER): Payer: Medicare HMO

## 2022-10-24 ENCOUNTER — Ambulatory Visit (HOSPITAL_BASED_OUTPATIENT_CLINIC_OR_DEPARTMENT_OTHER)
Admission: RE | Admit: 2022-10-24 | Discharge: 2022-10-24 | Disposition: A | Discharge status: Home / Self Care | Payer: Medicare HMO | Source: Ambulatory Visit | Attending: Cardiovascular Disease | Admitting: Cardiovascular Disease

## 2022-10-24 ENCOUNTER — Ambulatory Visit (HOSPITAL_COMMUNITY)
Admission: RE | Admit: 2022-10-24 | Discharge: 2022-10-24 | Disposition: A | Discharge status: Home / Self Care | Payer: Medicare HMO | Source: Ambulatory Visit | Attending: Cardiovascular Disease | Admitting: Cardiovascular Disease

## 2022-10-24 DIAGNOSIS — Z95820 Peripheral vascular angioplasty status with implants and grafts: Secondary | ICD-10-CM | POA: Insufficient documentation

## 2022-10-24 DIAGNOSIS — I739 Peripheral vascular disease, unspecified: Secondary | ICD-10-CM | POA: Diagnosis not present

## 2022-10-24 DIAGNOSIS — R0602 Shortness of breath: Secondary | ICD-10-CM

## 2022-10-24 LAB — ECHOCARDIOGRAM COMPLETE
Area-P 1/2: 4.17 cm2
S' Lateral: 2.4 cm

## 2022-10-26 ENCOUNTER — Other Ambulatory Visit: Payer: Self-pay

## 2022-10-26 DIAGNOSIS — I739 Peripheral vascular disease, unspecified: Secondary | ICD-10-CM

## 2022-11-02 ENCOUNTER — Telehealth: Payer: Self-pay | Admitting: Cardiovascular Disease

## 2022-11-02 NOTE — Telephone Encounter (Signed)
Patient is requesting a call back to discuss ABI/AORTA  results. She states she has concerns.

## 2022-11-05 NOTE — Telephone Encounter (Signed)
The patient called in with questions concerning her ABI and abnormal great toe finding. She has been educated on the results and all questions have been answered.   She did state that she stopped the Metoprolol because she felt worse when she took it. She denies any palpitations.  Her PCP did start her on 20 mg Furosemide for her lower leg edema.

## 2022-12-03 DIAGNOSIS — I6529 Occlusion and stenosis of unspecified carotid artery: Secondary | ICD-10-CM | POA: Diagnosis not present

## 2022-12-03 DIAGNOSIS — J449 Chronic obstructive pulmonary disease, unspecified: Secondary | ICD-10-CM | POA: Diagnosis not present

## 2022-12-03 DIAGNOSIS — D692 Other nonthrombocytopenic purpura: Secondary | ICD-10-CM | POA: Diagnosis not present

## 2022-12-03 DIAGNOSIS — R7309 Other abnormal glucose: Secondary | ICD-10-CM | POA: Diagnosis not present

## 2022-12-03 DIAGNOSIS — M8580 Other specified disorders of bone density and structure, unspecified site: Secondary | ICD-10-CM | POA: Diagnosis not present

## 2022-12-03 DIAGNOSIS — Z79899 Other long term (current) drug therapy: Secondary | ICD-10-CM | POA: Diagnosis not present

## 2022-12-03 DIAGNOSIS — R739 Hyperglycemia, unspecified: Secondary | ICD-10-CM | POA: Diagnosis not present

## 2022-12-03 DIAGNOSIS — M109 Gout, unspecified: Secondary | ICD-10-CM | POA: Diagnosis not present

## 2022-12-03 DIAGNOSIS — Z6824 Body mass index (BMI) 24.0-24.9, adult: Secondary | ICD-10-CM | POA: Diagnosis not present

## 2022-12-03 DIAGNOSIS — Z0001 Encounter for general adult medical examination with abnormal findings: Secondary | ICD-10-CM | POA: Diagnosis not present

## 2022-12-03 DIAGNOSIS — I10 Essential (primary) hypertension: Secondary | ICD-10-CM | POA: Diagnosis not present

## 2022-12-03 DIAGNOSIS — E782 Mixed hyperlipidemia: Secondary | ICD-10-CM | POA: Diagnosis not present

## 2022-12-03 DIAGNOSIS — D126 Benign neoplasm of colon, unspecified: Secondary | ICD-10-CM | POA: Diagnosis not present

## 2022-12-04 DIAGNOSIS — Z8781 Personal history of (healed) traumatic fracture: Secondary | ICD-10-CM | POA: Diagnosis not present

## 2022-12-04 DIAGNOSIS — M8589 Other specified disorders of bone density and structure, multiple sites: Secondary | ICD-10-CM | POA: Diagnosis not present

## 2022-12-04 DIAGNOSIS — N958 Other specified menopausal and perimenopausal disorders: Secondary | ICD-10-CM | POA: Diagnosis not present

## 2022-12-04 LAB — LAB REPORT - SCANNED
A1c: 5.7
Albumin, Urine POC: 9.8
Creatinine, POC: 439 mg/dL
EGFR (Non-African Amer.): 71
Microalb Creat Ratio: 22

## 2022-12-04 LAB — HM DEXA SCAN

## 2023-02-25 DIAGNOSIS — J449 Chronic obstructive pulmonary disease, unspecified: Secondary | ICD-10-CM | POA: Diagnosis not present

## 2023-02-25 DIAGNOSIS — Z1231 Encounter for screening mammogram for malignant neoplasm of breast: Secondary | ICD-10-CM | POA: Diagnosis not present

## 2023-02-25 LAB — HM MAMMOGRAPHY

## 2023-02-27 DIAGNOSIS — M810 Age-related osteoporosis without current pathological fracture: Secondary | ICD-10-CM | POA: Diagnosis not present

## 2023-02-27 DIAGNOSIS — M546 Pain in thoracic spine: Secondary | ICD-10-CM | POA: Diagnosis not present

## 2023-02-27 DIAGNOSIS — M545 Low back pain, unspecified: Secondary | ICD-10-CM | POA: Diagnosis not present

## 2023-02-27 DIAGNOSIS — M25551 Pain in right hip: Secondary | ICD-10-CM | POA: Diagnosis not present

## 2023-03-26 ENCOUNTER — Ambulatory Visit: Payer: Medicare HMO | Admitting: Cardiovascular Disease

## 2023-04-08 ENCOUNTER — Ambulatory Visit: Payer: Medicare HMO | Attending: Cardiovascular Disease | Admitting: Physician Assistant

## 2023-04-08 ENCOUNTER — Encounter: Payer: Self-pay | Admitting: Physician Assistant

## 2023-04-08 VITALS — BP 134/82 | HR 72 | Ht 62.0 in | Wt 134.6 lb

## 2023-04-08 DIAGNOSIS — I739 Peripheral vascular disease, unspecified: Secondary | ICD-10-CM

## 2023-04-08 DIAGNOSIS — I10 Essential (primary) hypertension: Secondary | ICD-10-CM

## 2023-04-08 DIAGNOSIS — R002 Palpitations: Secondary | ICD-10-CM

## 2023-04-08 NOTE — Patient Instructions (Addendum)
Medication Instructions:  The current medical regimen is effective;  continue present plan and medications as directed. Please refer to the Current Medication list given to you today.  *If you need a refill on your cardiac medications before your next appointment, please call your pharmacy*  Lab Work: NONE If you have labs (blood work) drawn today and your tests are completely normal, you will receive your results only by: MyChart Message (if you have MyChart) OR A paper copy in the mail If you have any lab test that is abnormal or we need to change your treatment, we will call you to review the results.  Testing/Procedures: NIONE  Follow-Up: At [facility], you and your health needs are our priority.  As part of our continuing mission to provide you with exceptional heart care, we have created designated Provider Care Teams.  These Care Teams include your primary Cardiologist (physician) and Advanced Practice Providers (APPs -  Physician Assistants and Nurse Practitioners) who all work together to provide you with the care you need, when you need it.  Your next appointment:   6 month(s)  Provider:   Lorine Bears, MD     Other Instructions

## 2023-04-08 NOTE — Progress Notes (Unsigned)
Cardiology Office Note:  .   Date:  04/09/2023  ID:  Carolyn Robinson, DOB [birth year], MRN [id number] PCP: Eldridge Abrahams, MD  [facility] Providers Cardiologist:  Lorine Bears, MD     History of Present Illness: .   Carolyn Robinson is a 72 y.o. female with PMH of HTN, HLD, prior tobacco use, PAD and palpitation.  Patient underwent right iliac artery stent placement and left iliac artery atherectomy and balloon angioplasty by Dr. Jenness Corner in 2016 in [place] for severe claudication.  She also had previous bilateral vein ablation by Dr. Earna Coder.  She has ZIO monitoring in 2022 that showed normal sinus rhythm with average heart rate 91 bpm, 3 runs of SVT, longest lasting 17 seconds.  Last carotid ultrasound obtained on 10/24/2021 demonstrated 1 to 39% bilateral ICA disease.  Last ABI obtained on 10/24/2022 showed normal ABI bilaterally.  Lower extremity arterial Doppler showed moderate to severe atherosclerosis seen throughout aorta and iliac artery, patent right common iliac artery, less than 50% stenosis in the left common iliac artery.  1 year repeat study was recommended.  She was last seen by Dr. Kirke Corin in March 2024 for increased palpitation.  Metoprolol succinate 25 mg daily was added to her medical regimen.  Subsequent echocardiogram obtained on 10/24/2022 showed EF 55 to 60%, no regional wall motion abnormality, grade 1 DD, RVSP 25.3 mmHg, trace MR.  Based on phone message in April, she has stopped her metoprolol succinate as it made her feel worse.  Her PCP did start her on PRN 20 mg Lasix for her lower extremity edema.  She presents today for 42-month follow-up.  Patient presents today for follow-up.  She has been doing well without any exertional chest pain or shortness of breath.  Despite coming off of metoprolol succinate, she does not have significant palpitation.  In the past month, she has only used Lasix twice.  I did not see any lower extremity edema.  Overall, she is doing well and  can follow-up with Dr. Kirke Corin in 6 months.  ROS:   She denies chest pain, palpitations, dyspnea, pnd, orthopnea, n, v, dizziness, syncope, edema, weight gain, or early satiety. All other systems reviewed and are otherwise negative except as noted above.    Studies Reviewed: .        Cardiac Studies & Procedures       ECHOCARDIOGRAM  ECHOCARDIOGRAM COMPLETE 10/24/2022  Narrative ECHOCARDIOGRAM REPORT    Patient Name:   Carolyn Robinson Date of Exam: 10/24/2022 Medical Rec #:  [id number]     Height:       62.0 in Accession #:    [id number]    Weight:       134.0 lb Date of Birth:  [birth year]     BSA:          1.612 m Patient Age:    69 years      BP:           130/70 mmHg Patient Gender: F             HR:           73 bpm. Exam Location:  [address]  Procedure: 2D Echo, Cardiac Doppler, Color Doppler and 3D Echo  Indications:    Dyspnea R06.00  History:        Patient has no prior history of Echocardiogram examinations. Risk Factors:Hypertension.  Sonographer:    Thurman Coyer RDCS Referring Phys: [id number] MUHAMMAD A ARIDA  IMPRESSIONS   1.  Left ventricular ejection fraction, by estimation, is 55 to 60%. Left ventricular ejection fraction by 3D volume is 58 %. The left ventricle has normal function. The left ventricle has no regional wall motion abnormalities. Left ventricular diastolic parameters are consistent with Grade I diastolic dysfunction (impaired relaxation). 2. Right ventricular systolic function is normal. The right ventricular size is normal. There is normal pulmonary artery systolic pressure. The estimated right ventricular systolic pressure is 25.3 mmHg. 3. The mitral valve is grossly normal. Trivial mitral valve regurgitation. 4. The aortic valve is tricuspid. Aortic valve regurgitation is not visualized. 5. The inferior vena cava is normal in size with greater than 50% respiratory variability, suggesting right atrial pressure of 3 mmHg.  Comparison(s): No  prior Echocardiogram.  FINDINGS Left Ventricle: Left ventricular ejection fraction, by estimation, is 55 to 60%. Left ventricular ejection fraction by 3D volume is 58 %. The left ventricle has normal function. The left ventricle has no regional wall motion abnormalities. The left ventricular internal cavity size was normal in size. There is no left ventricular hypertrophy. Left ventricular diastolic parameters are consistent with Grade I diastolic dysfunction (impaired relaxation). Indeterminate filling pressures.  Right Ventricle: The right ventricular size is normal. No increase in right ventricular wall thickness. Right ventricular systolic function is normal. There is normal pulmonary artery systolic pressure. The tricuspid regurgitant velocity is 2.36 m/s, and with an assumed right atrial pressure of 3 mmHg, the estimated right ventricular systolic pressure is 25.3 mmHg.  Left Atrium: Left atrial size was normal in size.  Right Atrium: Right atrial size was normal in size.  Pericardium: There is no evidence of pericardial effusion.  Mitral Valve: The mitral valve is grossly normal. Trivial mitral valve regurgitation.  Tricuspid Valve: The tricuspid valve is grossly normal. Tricuspid valve regurgitation is trivial.  Aortic Valve: The aortic valve is tricuspid. Aortic valve regurgitation is not visualized.  Pulmonic Valve: The pulmonic valve was normal in structure. Pulmonic valve regurgitation is not visualized.  Aorta: The aortic root and ascending aorta are structurally normal, with no evidence of dilitation.  Venous: The inferior vena cava is normal in size with greater than 50% respiratory variability, suggesting right atrial pressure of 3 mmHg.  IAS/Shunts: No atrial level shunt detected by color flow Doppler.   LEFT VENTRICLE PLAX 2D LVIDd:         4.30 cm         Diastology LVIDs:         2.40 cm         LV e' medial:    5.33 cm/s LV PW:         1.00 cm         LV E/e'  medial:  13.6 LV IVS:        1.00 cm         LV e' lateral:   6.64 cm/s LVOT diam:     2.10 cm         LV E/e' lateral: 10.9 LV SV:         54 LV SV Index:   34 LVOT Area:     3.46 cm        3D Volume EF LV 3D EF:    Left ventricul ar ejection fraction by 3D volume is 58 %.  3D Volume EF: 3D EF:        58 % LV EDV:       87 ml LV ESV:  36 ml LV SV:        51 ml  RIGHT VENTRICLE RV Basal diam:  2.60 cm RV Mid diam:    1.90 cm RV S prime:     14.40 cm/s TAPSE (M-mode): 2.3 cm  LEFT ATRIUM             Index        RIGHT ATRIUM           Index LA diam:        3.30 cm 2.05 cm/m   RA Area:     11.60 cm LA Vol (A2C):   44.5 ml 27.60 ml/m  RA Volume:   23.50 ml  14.57 ml/m LA Vol (A4C):   38.0 ml 23.57 ml/m LA Biplane Vol: 42.1 ml 26.11 ml/m AORTIC VALVE LVOT Vmax:   81.00 cm/s LVOT Vmean:  50.100 cm/s LVOT VTI:    0.157 m  AORTA Ao Root diam: 3.00 cm Ao Asc diam:  3.30 cm  MITRAL VALVE               TRICUSPID VALVE MV Area (PHT): 4.17 cm    TR Peak grad:   22.3 mmHg MV Decel Time: 182 msec    TR Vmax:        236.00 cm/s MV E velocity: 72.40 cm/s MV A velocity: 93.00 cm/s  SHUNTS MV E/A ratio:  0.78        Systemic VTI:  0.16 m Systemic Diam: 2.10 cm  Zoila Shutter MD Electronically signed by Zoila Shutter MD Signature Date/Time: 10/24/2022/4:14:56 PM    Final    MONITORS  LONG TERM MONITOR (3-14 DAYS) 10/31/2020  Narrative Patch Wear Time:  14 days and 0 hours  Patient had a min HR of 67 bpm, max HR of 187 bpm, and avg HR of 91 bpm. Predominant underlying rhythm was Sinus Rhythm. 3 Supraventricular Tachycardia runs occurred, the run with the fastest interval lasting 14.1 secs with a max rate of 187 bpm, the longest lasting 16.8 secs with an avg rate of 116 bpm. Rare PACs and rare PVCs.           Risk Assessment/Calculations:             Physical Exam:   VS:  BP 134/82 (BP Location: Left Arm, Patient Position: Sitting, Cuff Size: Normal)    Pulse 72   Ht 5\' 2"  (1.575 m)   Wt 134 lb 9.6 oz (61.1 kg)   BMI 24.62 kg/m    Wt Readings from Last 3 Encounters:  04/08/23 134 lb 9.6 oz (61.1 kg)  09/25/22 134 lb (60.8 kg)  06/20/21 141 lb (64 kg)    GEN: Well nourished, well developed in no acute distress NECK: No JVD; No carotid bruits CARDIAC: RRR, no murmurs, rubs, gallops RESPIRATORY:  Clear to auscultation without rales, wheezing or rhonchi  ABDOMEN: Soft, non-tender, non-distended EXTREMITIES:  No edema; No deformity   ASSESSMENT AND PLAN: .    Palpitation: She was unable to tolerate beta-blocker.  Palpitation is quite transient and rare.  Will continue observation at this time.  PAD: Denies any claudication symptoms.  Recent ABI was normal  Hypertension: Blood pressure well-controlled       Dispo: Follow-up in 6 months.  Signed, Azalee Course, PA

## 2023-05-24 DIAGNOSIS — Z961 Presence of intraocular lens: Secondary | ICD-10-CM | POA: Diagnosis not present

## 2023-05-24 DIAGNOSIS — H04221 Epiphora due to insufficient drainage, right lacrimal gland: Secondary | ICD-10-CM | POA: Diagnosis not present

## 2023-05-24 DIAGNOSIS — H11823 Conjunctivochalasis, bilateral: Secondary | ICD-10-CM | POA: Diagnosis not present

## 2023-05-24 DIAGNOSIS — H35361 Drusen (degenerative) of macula, right eye: Secondary | ICD-10-CM | POA: Diagnosis not present

## 2023-05-24 DIAGNOSIS — H04563 Stenosis of bilateral lacrimal punctum: Secondary | ICD-10-CM | POA: Diagnosis not present

## 2023-06-03 DIAGNOSIS — I739 Peripheral vascular disease, unspecified: Secondary | ICD-10-CM | POA: Diagnosis not present

## 2023-06-03 DIAGNOSIS — Z6824 Body mass index (BMI) 24.0-24.9, adult: Secondary | ICD-10-CM | POA: Diagnosis not present

## 2023-06-03 DIAGNOSIS — D45 Polycythemia vera: Secondary | ICD-10-CM | POA: Diagnosis not present

## 2023-06-03 DIAGNOSIS — Z79899 Other long term (current) drug therapy: Secondary | ICD-10-CM | POA: Diagnosis not present

## 2023-06-03 DIAGNOSIS — I10 Essential (primary) hypertension: Secondary | ICD-10-CM | POA: Diagnosis not present

## 2023-06-03 DIAGNOSIS — M810 Age-related osteoporosis without current pathological fracture: Secondary | ICD-10-CM | POA: Diagnosis not present

## 2023-06-03 DIAGNOSIS — M109 Gout, unspecified: Secondary | ICD-10-CM | POA: Diagnosis not present

## 2023-06-03 DIAGNOSIS — J449 Chronic obstructive pulmonary disease, unspecified: Secondary | ICD-10-CM | POA: Diagnosis not present

## 2023-06-03 DIAGNOSIS — G4700 Insomnia, unspecified: Secondary | ICD-10-CM | POA: Diagnosis not present

## 2023-06-03 DIAGNOSIS — D692 Other nonthrombocytopenic purpura: Secondary | ICD-10-CM | POA: Diagnosis not present

## 2023-06-03 DIAGNOSIS — E782 Mixed hyperlipidemia: Secondary | ICD-10-CM | POA: Diagnosis not present

## 2023-06-26 ENCOUNTER — Ambulatory Visit: Payer: Self-pay | Admitting: Family Medicine

## 2023-07-18 DIAGNOSIS — J019 Acute sinusitis, unspecified: Secondary | ICD-10-CM | POA: Diagnosis not present

## 2023-07-18 DIAGNOSIS — J029 Acute pharyngitis, unspecified: Secondary | ICD-10-CM | POA: Diagnosis not present

## 2023-07-18 DIAGNOSIS — I10 Essential (primary) hypertension: Secondary | ICD-10-CM | POA: Diagnosis not present

## 2023-07-18 DIAGNOSIS — Z87891 Personal history of nicotine dependence: Secondary | ICD-10-CM | POA: Diagnosis not present

## 2023-07-25 ENCOUNTER — Ambulatory Visit: Payer: Self-pay | Admitting: Internal Medicine

## 2023-07-29 DIAGNOSIS — H9313 Tinnitus, bilateral: Secondary | ICD-10-CM | POA: Diagnosis not present

## 2023-07-29 DIAGNOSIS — J309 Allergic rhinitis, unspecified: Secondary | ICD-10-CM | POA: Diagnosis not present

## 2023-08-22 DIAGNOSIS — H04222 Epiphora due to insufficient drainage, left lacrimal gland: Secondary | ICD-10-CM | POA: Diagnosis not present

## 2023-08-22 DIAGNOSIS — Z01818 Encounter for other preprocedural examination: Secondary | ICD-10-CM | POA: Diagnosis not present

## 2023-08-22 DIAGNOSIS — H11823 Conjunctivochalasis, bilateral: Secondary | ICD-10-CM | POA: Diagnosis not present

## 2023-08-22 DIAGNOSIS — H04543 Stenosis of bilateral lacrimal canaliculi: Secondary | ICD-10-CM | POA: Diagnosis not present

## 2023-08-22 DIAGNOSIS — H04223 Epiphora due to insufficient drainage, bilateral lacrimal glands: Secondary | ICD-10-CM | POA: Diagnosis not present

## 2023-08-22 DIAGNOSIS — H02054 Trichiasis without entropian left upper eyelid: Secondary | ICD-10-CM | POA: Diagnosis not present

## 2023-08-22 DIAGNOSIS — H04563 Stenosis of bilateral lacrimal punctum: Secondary | ICD-10-CM | POA: Diagnosis not present

## 2023-08-22 DIAGNOSIS — H04213 Epiphora due to excess lacrimation, bilateral lacrimal glands: Secondary | ICD-10-CM | POA: Diagnosis not present

## 2023-08-22 DIAGNOSIS — H02132 Senile ectropion of right lower eyelid: Secondary | ICD-10-CM | POA: Diagnosis not present

## 2023-08-22 DIAGNOSIS — H0279 Other degenerative disorders of eyelid and periocular area: Secondary | ICD-10-CM | POA: Diagnosis not present

## 2023-08-22 DIAGNOSIS — H02051 Trichiasis without entropian right upper eyelid: Secondary | ICD-10-CM | POA: Diagnosis not present

## 2023-08-22 DIAGNOSIS — H04221 Epiphora due to insufficient drainage, right lacrimal gland: Secondary | ICD-10-CM | POA: Diagnosis not present

## 2023-08-29 ENCOUNTER — Ambulatory Visit (INDEPENDENT_AMBULATORY_CARE_PROVIDER_SITE_OTHER): Payer: Medicare HMO | Admitting: Internal Medicine

## 2023-08-29 ENCOUNTER — Encounter: Payer: Self-pay | Admitting: Internal Medicine

## 2023-08-29 VITALS — BP 130/72 | HR 87 | Ht 62.5 in | Wt 133.4 lb

## 2023-08-29 DIAGNOSIS — R739 Hyperglycemia, unspecified: Secondary | ICD-10-CM

## 2023-08-29 DIAGNOSIS — Z800 Family history of malignant neoplasm of digestive organs: Secondary | ICD-10-CM | POA: Diagnosis not present

## 2023-08-29 DIAGNOSIS — M810 Age-related osteoporosis without current pathological fracture: Secondary | ICD-10-CM

## 2023-08-29 DIAGNOSIS — F5101 Primary insomnia: Secondary | ICD-10-CM | POA: Diagnosis not present

## 2023-08-29 DIAGNOSIS — I10 Essential (primary) hypertension: Secondary | ICD-10-CM | POA: Insufficient documentation

## 2023-08-29 DIAGNOSIS — I739 Peripheral vascular disease, unspecified: Secondary | ICD-10-CM | POA: Diagnosis not present

## 2023-08-29 DIAGNOSIS — E782 Mixed hyperlipidemia: Secondary | ICD-10-CM

## 2023-08-29 DIAGNOSIS — J329 Chronic sinusitis, unspecified: Secondary | ICD-10-CM | POA: Diagnosis not present

## 2023-08-29 MED ORDER — AMLODIPINE BESYLATE 5 MG PO TABS: 5.0000 mg | ORAL_TABLET | Freq: Two times a day (BID) | ORAL | 1 refills | Status: DC

## 2023-08-29 MED ORDER — ALENDRONATE SODIUM 70 MG PO TABS: 70.0000 mg | ORAL_TABLET | ORAL | 11 refills | Status: DC

## 2023-08-29 MED ORDER — ROSUVASTATIN CALCIUM 10 MG PO TABS: 20.0000 mg | ORAL_TABLET | Freq: Every day | ORAL | 3 refills | Status: DC

## 2023-08-29 MED ORDER — FENOFIBRATE 160 MG PO TABS: 160.0000 mg | ORAL_TABLET | Freq: Every day | ORAL | 3 refills | Status: DC

## 2023-08-29 NOTE — Progress Notes (Unsigned)
New Patient Office Visit  Subjective:  Patient ID: Carolyn Robinson, female    DOB: 1950/11/24  Age: 73 y.o. MRN: [id number]  CC:  Chief Complaint  Patient presents with   Establish Care    Needs a new pcp.     HPI Carolyn Robinson is a 73 y.o. female with past medical history of HTN, PAD, HLD, osteoporosis and chronic sinusitis who presents for establishing care.  HTN: Her BP is WNL today.  She takes amlodipine 5 mg BID currently.  Denies any headache, dizziness, chest pain, dyspnea or palpitations currently.  PAD: She takes aspirin 81 mg once daily and Crestor 20 mg QD. She is status post right iliac artery stent placement (8 x 57 mm VISI Pro stent) and left iliac artery atherectomy? and balloon angioplasty done by Dr. Jenness Corner in 2016 in [place] for severe claudication. She also had previous bilateral vein ablation by Dr. Earna Coder.  Denies claudication symptoms.  She has Lasix as needed for leg swelling, but rarely needs it.  Osteoporosis: Her last DEXA scan in 06/24 showed osteoporosis, and started taking alendronate in 11/24.  She also takes vitamin D 5000 IU QD.  Chronic sinusitis: She takes Xyzal for it.  Followed by [facility] ENT specialist. Denies any recent fever, chills, recent worsening of sinus pressure or postnasal drip.    Past Medical History:  Diagnosis Date   Hypercholesterolemia    Hypertension    PAD (peripheral artery disease) (HCC)    Status post right iliac stent placement and angioplasty of the left iliac artery in 2016 by Dr. Jenness Corner in [place].    Past Surgical History:  Procedure Laterality Date   ABDOMINAL HYSTERECTOMY     ENDOVENOUS ABLATION SAPHENOUS VEIN W/ LASER Bilateral    stent in r leg      Family History  Problem Relation Age of Onset   Colon cancer Paternal Uncle    Colon cancer Paternal Uncle    Colon cancer Maternal Aunt    Colon cancer Cousin     Social History   Socioeconomic History   Marital status: Divorced    Spouse name: Not on  file   Number of children: Not on file   Years of education: Not on file   Highest education level: GED or equivalent  Occupational History   Not on file  Tobacco Use   Smoking status: Former    Current packs/day: 0.00    Types: Cigarettes    Quit date: 2003    Years since quitting: 22.1   Smokeless tobacco: Never  Substance and Sexual Activity   Alcohol use: Yes    Comment: occ   Drug use: No   Sexual activity: Not on file  Other Topics Concern   Not on file  Social History Narrative   Not on file   Social Drivers of Health   Financial Resource Strain: High Risk (08/26/2023)   Overall Financial Resource Strain (CARDIA)    Difficulty of Paying Living Expenses: Hard  Food Insecurity: Food Insecurity Present (08/26/2023)   Hunger Vital Sign    Worried About Running Out of Food in the Last Year: Sometimes true    Ran Out of Food in the Last Year: Sometimes true  Transportation Needs: No Transportation Needs (08/26/2023)   PRAPARE - Administrator, Civil Service (Medical): No    Lack of Transportation (Non-Medical): No  Physical Activity: Sufficiently Active (08/26/2023)   Exercise Vital Sign    Days of Exercise per Week:  5 days    Minutes of Exercise per Session: 60 min  Stress: No Stress Concern Present (08/26/2023)   [employer] of Occupational Health - Occupational Stress Questionnaire    Feeling of Stress : Not at all  Social Connections: Socially Isolated (08/26/2023)   Social Connection and Isolation Panel [NHANES]    Frequency of Communication with Friends and Family: More than three times a week    Frequency of Social Gatherings with Friends and Family: Three times a week    Attends Religious Services: Never    Active Member of Clubs or Organizations: No    Attends Engineer, structural: Not on file    Marital Status: Divorced  Intimate Partner Violence: Not on file    ROS Review of Systems  Constitutional:  Negative for chills and  fever.  HENT:  Positive for congestion. Negative for sinus pressure, sinus pain and sore throat.   Eyes:  Negative for pain and discharge.  Respiratory:  Negative for cough and shortness of breath.   Cardiovascular:  Negative for chest pain and palpitations.  Gastrointestinal:  Negative for abdominal pain, diarrhea, nausea and vomiting.  Endocrine: Negative for polydipsia and polyuria.  Genitourinary:  Negative for dysuria and hematuria.  Musculoskeletal:  Negative for neck pain and neck stiffness.  Skin:  Negative for rash.  Neurological:  Negative for dizziness and weakness.  Psychiatric/Behavioral:  Negative for agitation and behavioral problems.     Objective:   Today's Vitals: BP 130/72   Pulse 87   Ht 5' 2.5" (1.588 m)   Wt 133 lb 6.4 oz (60.5 kg)   SpO2 95%   BMI 24.01 kg/m   Physical Exam Vitals reviewed.  Constitutional:      General: She is not in acute distress.    Appearance: She is not diaphoretic.  HENT:     Head: Normocephalic and atraumatic.     Nose: Nose normal.     Mouth/Throat:     Mouth: Mucous membranes are moist.  Eyes:     General: No scleral icterus.    Extraocular Movements: Extraocular movements intact.  Cardiovascular:     Rate and Rhythm: Normal rate and regular rhythm.     Heart sounds: Normal heart sounds. No murmur heard. Pulmonary:     Breath sounds: Normal breath sounds. No wheezing or rales.  Musculoskeletal:     Cervical back: Neck supple. No tenderness.     Right lower leg: No edema.     Left lower leg: No edema.  Skin:    General: Skin is warm.     Findings: No rash.  Neurological:     General: No focal deficit present.     Mental Status: She is alert and oriented to person, place, and time.  Psychiatric:        Mood and Affect: Mood normal.        Behavior: Behavior normal.     Assessment & Plan:   Problem List Items Addressed This Visit       Cardiovascular and Mediastinum   Essential hypertension - Primary   BP  Readings from Last 1 Encounters:  08/29/23 130/72   Well-controlled with Amlodipine 5 mg BID, had fluctuating BP with once daily dosing Has had rash with ACEi and ARB, did not tolerate hydrochlorothiazide as well Counseled for compliance with the medications Advised DASH diet and moderate exercise/walking, at least 150 mins/week       Relevant Medications   amLODipine (NORVASC) 5 MG tablet  fenofibrate 160 MG tablet   rosuvastatin (CRESTOR) 10 MG tablet   Other Relevant Orders   CMP14+EGFR   CBC with Differential/Platelet   PAD (peripheral artery disease) (HCC)   Status post right iliac artery stent placement (8 x 57 mm VISI Pro stent) and left iliac artery atherectomy? and balloon angioplasty done by Dr. Jenness Corner in 2016 in [place]  On aspirin and statin Denies claudication symptoms currently Followed by cardiology      Relevant Medications   amLODipine (NORVASC) 5 MG tablet   fenofibrate 160 MG tablet   rosuvastatin (CRESTOR) 10 MG tablet     Respiratory   Chronic sinusitis   Well-controlled with Xyzal Followed by [facility] specialist        Musculoskeletal and Integument   Osteoporosis   On alendronate (since 05/2023) Takes vitamin D 5000 IU QD      Relevant Medications   Cholecalciferol (VITAMIN D-3) 125 MCG (5000 UT) TABS   alendronate (FOSAMAX) 70 MG tablet     Other   Mixed hyperlipidemia   On Crestor 20 mg QD and fenofibrate 160 mg QD, refilled Last lipid profile reviewed      Relevant Medications   amLODipine (NORVASC) 5 MG tablet   fenofibrate 160 MG tablet   rosuvastatin (CRESTOR) 10 MG tablet   Other Relevant Orders   Lipid Profile   Family history of colon cancer   Gets colonoscopy in [place] every 5 years, last on 01/30/19      Primary insomnia   Takes Ambien 5 mg at bedtime, takes it rarely      Other Visit Diagnoses       Hyperglycemia       Relevant Orders   Hemoglobin A1c       Outpatient Encounter Medications as of  08/29/2023  Medication Sig   Aspirin Buf,CaCarb-MgCarb-MgO, 81 MG TABS Take by mouth.   aspirin EC 81 MG tablet Take 81 mg by mouth daily. Swallow whole.   Cholecalciferol (VITAMIN D-3) 125 MCG (5000 UT) TABS Take by mouth.   furosemide (LASIX) 20 MG tablet Take 20 mg by mouth as needed.   Lactobacillus (PROBIOTIC ACIDOPHILUS PO) Take by mouth.   levocetirizine (XYZAL) 5 MG tablet Take 5 mg by mouth every evening.   ZINC-VITAMIN C PO Take by mouth.   zolpidem (AMBIEN) 5 MG tablet Take 5 mg by mouth at bedtime as needed for sleep.   [DISCONTINUED] alendronate (FOSAMAX) 70 MG tablet Take 70 mg by mouth once a week.   [DISCONTINUED] amLODipine (NORVASC) 5 MG tablet Take 5 mg by mouth 2 (two) times daily.   [DISCONTINUED] fenofibrate 160 MG tablet Take 160 mg by mouth daily.   [DISCONTINUED] rosuvastatin (CRESTOR) 10 MG tablet Take 20 mg by mouth daily.   alendronate (FOSAMAX) 70 MG tablet Take 1 tablet (70 mg total) by mouth once a week.   amLODipine (NORVASC) 5 MG tablet Take 1 tablet (5 mg total) by mouth 2 (two) times daily.   fenofibrate 160 MG tablet Take 1 tablet (160 mg total) by mouth daily.   rosuvastatin (CRESTOR) 10 MG tablet Take 2 tablets (20 mg total) by mouth daily.   [DISCONTINUED] albuterol (PROVENTIL HFA;VENTOLIN HFA) 108 (90 BASE) MCG/ACT inhaler Inhale into the lungs every 6 (six) hours as needed for wheezing or shortness of breath. (Patient not taking: Reported on 04/08/2023)   [DISCONTINUED] metoprolol succinate (TOPROL-XL) 25 MG 24 hr tablet Take 1 tablet (25 mg total) by mouth daily. Take with or immediately following  a meal. (Patient not taking: Reported on 11/05/2022)   No facility-administered encounter medications on file as of 08/29/2023.    Follow-up: Return in about 6 months (around 02/26/2024) for HTN and HLD.   Anabel Halon, MD

## 2023-08-29 NOTE — Patient Instructions (Addendum)
Please continue to take medications as prescribed.  Please continue to follow low salt diet and perform moderate exercise/walking at least 150 mins/week.  Please get fasting blood tests done before the next visit.

## 2023-08-30 DIAGNOSIS — F5101 Primary insomnia: Secondary | ICD-10-CM | POA: Insufficient documentation

## 2023-08-30 DIAGNOSIS — Z800 Family history of malignant neoplasm of digestive organs: Secondary | ICD-10-CM | POA: Insufficient documentation

## 2023-08-30 DIAGNOSIS — I739 Peripheral vascular disease, unspecified: Secondary | ICD-10-CM | POA: Insufficient documentation

## 2023-08-30 NOTE — Assessment & Plan Note (Signed)
Takes Ambien 5 mg at bedtime, takes it rarely

## 2023-08-30 NOTE — Assessment & Plan Note (Signed)
On alendronate (since 05/2023) Takes vitamin D 5000 IU QD

## 2023-08-30 NOTE — Assessment & Plan Note (Addendum)
Status post right iliac artery stent placement (8 x 57 mm VISI Pro stent) and left iliac artery atherectomy? and balloon angioplasty done by Dr. Jenness Corner in 2016 in [place]  On aspirin and statin Denies claudication symptoms currently Followed by cardiology

## 2023-08-30 NOTE — Assessment & Plan Note (Signed)
Well-controlled with Xyzal Followed by [facility] ENT specialist

## 2023-08-30 NOTE — Assessment & Plan Note (Addendum)
Gets colonoscopy in [place] every 5 years, last on 01/30/19

## 2023-08-30 NOTE — Assessment & Plan Note (Addendum)
BP Readings from Last 1 Encounters:  08/29/23 130/72   Well-controlled with Amlodipine 5 mg BID, had fluctuating BP with once daily dosing Has had rash with ACEi and ARB, did not tolerate hydrochlorothiazide as well Counseled for compliance with the medications Advised DASH diet and moderate exercise/walking, at least 150 mins/week

## 2023-08-30 NOTE — Assessment & Plan Note (Signed)
On Crestor 20 mg QD and fenofibrate 160 mg QD, refilled Last lipid profile reviewed

## 2023-10-09 ENCOUNTER — Ambulatory Visit: Payer: Medicare HMO | Admitting: Cardiovascular Disease

## 2023-10-24 ENCOUNTER — Ambulatory Visit (HOSPITAL_COMMUNITY)
Admission: RE | Admit: 2023-10-24 | Discharge: 2023-10-24 | Disposition: A | Discharge status: Home / Self Care | Source: Ambulatory Visit | Attending: Cardiovascular Disease | Admitting: Cardiovascular Disease

## 2023-10-24 ENCOUNTER — Ambulatory Visit (HOSPITAL_BASED_OUTPATIENT_CLINIC_OR_DEPARTMENT_OTHER)
Admission: RE | Admit: 2023-10-24 | Discharge: 2023-10-24 | Disposition: A | Discharge status: Home / Self Care | Source: Ambulatory Visit | Attending: Cardiovascular Disease | Admitting: Cardiovascular Disease

## 2023-10-24 DIAGNOSIS — Z95820 Peripheral vascular angioplasty status with implants and grafts: Secondary | ICD-10-CM | POA: Diagnosis not present

## 2023-10-24 DIAGNOSIS — I739 Peripheral vascular disease, unspecified: Secondary | ICD-10-CM | POA: Insufficient documentation

## 2023-10-27 LAB — VAS US ABI WITH/WO TBI
Left ABI: 1.04
Right ABI: 1

## 2023-10-28 ENCOUNTER — Other Ambulatory Visit (HOSPITAL_COMMUNITY): Payer: Self-pay | Admitting: *Deleted

## 2023-10-28 DIAGNOSIS — I739 Peripheral vascular disease, unspecified: Secondary | ICD-10-CM

## 2023-11-05 ENCOUNTER — Ambulatory Visit: Admitting: Cardiovascular Disease

## 2023-11-11 NOTE — Progress Notes (Unsigned)
 Cardiology Office Note   Date:  11/12/2023   ID:  Carolyn Robinson, DOB 26-Oct-1950, MRN [id number]  PCP:  Meldon Sport, MD  Cardiologist:   Antionette Kirks, MD   No chief complaint on file.      History of Present Illness: Carolyn Robinson is a 73 y.o. female who is here today for follow-up visit regarding peripheral arterial disease and palpitations. The patient has history of essential hypertension, hyperlipidemia, previous tobacco use and peripheral arterial disease. She is status post right iliac artery stent placement (8 x 57 mm VISI Pro stent) and left iliac artery atherectomy?  and balloon angioplasty done by Dr. Hurshel Maidens in 2016 in [place] for severe claudication.  She also had previous bilateral vein ablation by Dr. Raechel Bulla.  She is not aware of any previous cardiac history and she is not diabetic.  She had a ZIO monitor for palpitation in 2022  that showed normal sinus rhythm with an average heart rate of 91 bpm.  She had 3 SVT runs the longest lasted 17 seconds.   We tried Toprol  25 mg once daily last year but she felt worse with the medication and thus it was stopped.  She had an echocardiogram done in April 2024 which showed normal LV systolic function with no significant valvular abnormalities. She had recent Doppler studies that showed normal ABI bilaterally with patent iliac stents.  No leg claudication   Past Medical History:  Diagnosis Date   Hypercholesterolemia    Hypertension    PAD (peripheral artery disease) (HCC)    Status post right iliac stent placement and angioplasty of the left iliac artery in 2016 by Dr. Hurshel Maidens in [place] [employer].    Past Surgical History:  Procedure Laterality Date   ABDOMINAL HYSTERECTOMY     ENDOVENOUS ABLATION SAPHENOUS VEIN W/ LASER Bilateral    stent in r leg       Current Outpatient Medications  Medication Sig Dispense Refill   alendronate  (FOSAMAX ) 70 MG tablet Take 1 tablet (70 mg total) by mouth once a week. 4 tablet 11    amLODipine  (NORVASC ) 5 MG tablet Take 1 tablet (5 mg total) by mouth 2 (two) times daily. 180 tablet 1   Aspirin Buf,CaCarb-MgCarb-MgO, 81 MG TABS Take by mouth.     aspirin EC 81 MG tablet Take 81 mg by mouth daily. Swallow whole.     Cholecalciferol (VITAMIN D-3) 125 MCG (5000 UT) TABS Take by mouth.     fenofibrate  160 MG tablet Take 1 tablet (160 mg total) by mouth daily. 90 tablet 3   furosemide (LASIX) 20 MG tablet Take 20 mg by mouth as needed.     Lactobacillus (PROBIOTIC ACIDOPHILUS PO) Take by mouth.     levocetirizine (XYZAL) 5 MG tablet Take 5 mg by mouth every evening.     rosuvastatin  (CRESTOR ) 10 MG tablet Take 2 tablets (20 mg total) by mouth daily. 90 tablet 3   ZINC-VITAMIN C PO Take by mouth.     zolpidem (AMBIEN) 5 MG tablet Take 5 mg by mouth at bedtime as needed for sleep.     No current facility-administered medications for this visit.    Allergies:   Ace inhibitors, Ciprofloxacin, Ezetimibe, Hctz [hydrochlorothiazide], Losartan, and Sulfa antibiotics    Social History:  The patient  reports that she quit smoking about 22 years ago. Her smoking use included cigarettes. She has never used smokeless tobacco. She reports current alcohol use. She reports that she does not use drugs.  Family History:  The patient's family history includes Colon cancer in her cousin, maternal aunt, paternal uncle, and paternal uncle.    ROS:  Please see the history of present illness.   Otherwise, review of systems are positive for none.   All other systems are reviewed and negative.    PHYSICAL EXAM: VS:  BP 135/84 (BP Location: Left Arm, Patient Position: Sitting)   Pulse 77   Ht 5\' 2"  (1.575 m)   Wt 125 lb 3.2 oz (56.8 kg)   SpO2 96%   BMI 22.90 kg/m  , BMI Body mass index is 22.9 kg/m. GEN: Well nourished, well developed, in no acute distress  HEENT: normal  Neck: no JVD,  or masses.  Right carotid bruit Cardiac: RRR; no murmurs, rubs, or gallops,no edema  Respiratory:   clear to auscultation bilaterally, normal work of breathing GI: soft, nontender, nondistended, + BS MS: no deformity or atrophy  Skin: warm and dry, no rash Neuro:  Strength and sensation are intact Psych: euthymic mood, full affect    EKG:  EKG is ordered today. EKG showed: Sinus rhythm with short PR Low voltage QRS Septal infarct , age undetermined No significant change from before   Recent Labs: No results found for requested labs within last 365 days.    Lipid Panel No results found for: "CHOL", "TRIG", "HDL", "CHOLHDL", "VLDL", "LDLCALC", "LDLDIRECT"    Wt Readings from Last 3 Encounters:  11/12/23 125 lb 3.2 oz (56.8 kg)  08/29/23 133 lb 6.4 oz (60.5 kg)  04/08/23 134 lb 9.6 oz (61.1 kg)            No data to display            ASSESSMENT AND PLAN:  1.  Peripheral arterial disease: Status post bilateral iliac artery endovascular intervention in 2016.  Most recent duplex showed patent iliac stents with normal ABI.  Currently with no claudication.  Repeat studies in 1 year.  2.  Moderate right carotid stenosis: Most recent carotid Doppler in April 2023 showed mild nonobstructive disease bilaterally.  Will consider repeat carotid Doppler in a year.  3.  Palpitations: Previous outpatient monitor in 2022 showed short runs of SVT.  She did not tolerate small dose Toprol  and her symptoms are stable overall.  4.  Essential hypertension: Blood pressure is well controlled.  5.  Hyperlipidemia: Currently on rosuvastatin  and fenofibrate .  Most recent lipid profile showed an LDL of 68.    Disposition:   FU with me in 12 months.  Signed,  Antionette Kirks, MD  11/12/2023 10:18 AM    [facility]

## 2023-11-12 ENCOUNTER — Ambulatory Visit: Attending: Cardiovascular Disease | Admitting: Cardiovascular Disease

## 2023-11-12 VITALS — BP 135/84 | HR 77 | Ht 62.0 in | Wt 125.2 lb

## 2023-11-12 DIAGNOSIS — R002 Palpitations: Secondary | ICD-10-CM | POA: Diagnosis not present

## 2023-11-12 DIAGNOSIS — I6521 Occlusion and stenosis of right carotid artery: Secondary | ICD-10-CM

## 2023-11-12 DIAGNOSIS — E785 Hyperlipidemia, unspecified: Secondary | ICD-10-CM

## 2023-11-12 DIAGNOSIS — R0602 Shortness of breath: Secondary | ICD-10-CM

## 2023-11-12 DIAGNOSIS — I10 Essential (primary) hypertension: Secondary | ICD-10-CM

## 2023-11-12 DIAGNOSIS — I739 Peripheral vascular disease, unspecified: Secondary | ICD-10-CM

## 2023-11-12 NOTE — Patient Instructions (Signed)
 Medication Instructions:  No changes *If you need a refill on your cardiac medications before your next appointment, please call your pharmacy*  Lab Work: None ordered  If you have labs (blood work) drawn today and your tests are completely normal, you will receive your results only by: MyChart Message (if you have MyChart) OR A paper copy in the mail If you have any lab test that is abnormal or we need to change your treatment, we will call you to review the results.  Testing/Procedures: None ordered  Follow-Up: At [facility], you and your health needs are our priority.  As part of our continuing mission to provide you with exceptional heart care, our providers are all part of one team.  This team includes your primary Cardiologist (physician) and Advanced Practice Providers or APPs (Physician Assistants and Nurse Practitioners) who all work together to provide you with the care you need, when you need it.  Your next appointment:   12 month(s)  Provider:   Antionette Kirks, MD    We recommend signing up for the patient portal called "MyChart".  Sign up information is provided on this After Visit Summary.  MyChart is used to connect with patients for Virtual Visits (Telemedicine).  Patients are able to view lab/test results, encounter notes, upcoming appointments, etc.  Non-urgent messages can be sent to your provider as well.   To learn more about what you can do with MyChart, go to [email or web address].

## 2023-11-15 ENCOUNTER — Ambulatory Visit: Admitting: Physician Assistant

## 2024-01-28 ENCOUNTER — Other Ambulatory Visit: Payer: Self-pay | Admitting: Internal Medicine

## 2024-01-28 MED ORDER — FUROSEMIDE 20 MG PO TABS: 20.0000 mg | ORAL_TABLET | ORAL | 2 refills | Status: DC | PRN

## 2024-01-28 NOTE — Telephone Encounter (Signed)
 Copied from CRM [id number]. Topic: Clinical - Medication Refill >> Jan 28, 2024 11:17 AM Rosaria E wrote: Medication:  furosemide  (LASIX ) 20 MG tablet  Has the patient contacted their pharmacy? Yes (Agent: If no, request that the patient contact the pharmacy for the refill. If patient does not wish to contact the pharmacy document the reason why and proceed with request.) (Agent: If yes, when and what did the pharmacy advise?)  This is the patient's preferred pharmacy:  [facility] [address] [place] - [address] [place] [address] Phone: [phone] Fax: [phone]  Is this the correct pharmacy for this prescription? Yes If no, delete pharmacy and type the correct one.   Has the prescription been filled recently? Yes  Is the patient out of the medication? Yes  Has the patient been seen for an appointment in the last year OR does the patient have an upcoming appointment? Yes  Can we respond through MyChart? Yes  Agent: Please be advised that Rx refills may take up to 3 business days. We ask that you follow-up with your pharmacy.

## 2024-02-06 DIAGNOSIS — W57XXXA Bitten or stung by nonvenomous insect and other nonvenomous arthropods, initial encounter: Secondary | ICD-10-CM | POA: Diagnosis not present

## 2024-02-06 DIAGNOSIS — M25511 Pain in right shoulder: Secondary | ICD-10-CM | POA: Diagnosis not present

## 2024-02-06 DIAGNOSIS — R5383 Other fatigue: Secondary | ICD-10-CM | POA: Diagnosis not present

## 2024-02-06 DIAGNOSIS — M79671 Pain in right foot: Secondary | ICD-10-CM | POA: Diagnosis not present

## 2024-02-26 ENCOUNTER — Ambulatory Visit: Payer: Medicare HMO | Admitting: Internal Medicine

## 2024-03-02 ENCOUNTER — Other Ambulatory Visit: Payer: Self-pay | Admitting: Internal Medicine

## 2024-03-02 DIAGNOSIS — E782 Mixed hyperlipidemia: Secondary | ICD-10-CM

## 2024-03-02 DIAGNOSIS — I10 Essential (primary) hypertension: Secondary | ICD-10-CM

## 2024-03-02 MED ORDER — AMLODIPINE BESYLATE 5 MG PO TABS
5.0000 mg | ORAL_TABLET | Freq: Two times a day (BID) | ORAL | 1 refills | Status: DC
Start: 2024-03-02 — End: 2024-06-03

## 2024-03-02 MED ORDER — ROSUVASTATIN CALCIUM 10 MG PO TABS: 20.0000 mg | ORAL_TABLET | Freq: Every day | ORAL | 3 refills | Status: DC

## 2024-03-02 NOTE — Telephone Encounter (Signed)
 Copied from CRM #[id number]. Topic: Clinical - Medication Refill >> Mar 02, 2024 12:59 PM Selinda RAMAN wrote: Medication: amLODipine  (NORVASC ) 5 MG tablet, rosuvastatin  (CRESTOR ) 10 MG tablet  Has the patient contacted their pharmacy? Yes   This is the patient's preferred pharmacy:  [facility] [address] [place] - [address] [place] [address] Phone: [phone] Fax: [phone]  Is this the correct pharmacy for this prescription? Yes If no, delete pharmacy and type the correct one.   Has the prescription been filled recently? No  Is the patient out of the medication? No  Has the patient been seen for an appointment in the last year OR does the patient have an upcoming appointment? Yes  Can we respond through MyChart? Yes  Please assist patient further as she never picked up the original scripts from Dr Tobie  back in February because she had some leftover from her previous provider

## 2024-03-25 ENCOUNTER — Encounter: Payer: Self-pay | Admitting: Internal Medicine

## 2024-03-25 ENCOUNTER — Ambulatory Visit (INDEPENDENT_AMBULATORY_CARE_PROVIDER_SITE_OTHER): Admitting: Internal Medicine

## 2024-03-25 VITALS — BP 138/62 | HR 78 | Ht 62.5 in | Wt 119.0 lb

## 2024-03-25 DIAGNOSIS — J329 Chronic sinusitis, unspecified: Secondary | ICD-10-CM | POA: Diagnosis not present

## 2024-03-25 DIAGNOSIS — F4320 Adjustment disorder, unspecified: Secondary | ICD-10-CM

## 2024-03-25 DIAGNOSIS — Z0001 Encounter for general adult medical examination with abnormal findings: Secondary | ICD-10-CM | POA: Diagnosis not present

## 2024-03-25 DIAGNOSIS — K047 Periapical abscess without sinus: Secondary | ICD-10-CM | POA: Insufficient documentation

## 2024-03-25 DIAGNOSIS — E782 Mixed hyperlipidemia: Secondary | ICD-10-CM | POA: Diagnosis not present

## 2024-03-25 DIAGNOSIS — F5101 Primary insomnia: Secondary | ICD-10-CM | POA: Diagnosis not present

## 2024-03-25 DIAGNOSIS — I10 Essential (primary) hypertension: Secondary | ICD-10-CM | POA: Diagnosis not present

## 2024-03-25 DIAGNOSIS — I739 Peripheral vascular disease, unspecified: Secondary | ICD-10-CM | POA: Diagnosis not present

## 2024-03-25 DIAGNOSIS — R739 Hyperglycemia, unspecified: Secondary | ICD-10-CM | POA: Diagnosis not present

## 2024-03-25 MED ORDER — AMOXICILLIN-POT CLAVULANATE 875-125 MG PO TABS: 1.0000 | ORAL_TABLET | Freq: Two times a day (BID) | ORAL | 0 refills | Status: DC

## 2024-03-25 NOTE — Assessment & Plan Note (Signed)
 Takes Ambien 5 mg at bedtime, takes it rarely

## 2024-03-25 NOTE — Patient Instructions (Addendum)
 Please start taking Augmentin  as prescribed.  Please contact your Dental Surgeon for tooth/gum infection.  Please continue to take medications as prescribed.  Please continue to follow low salt diet and perform moderate exercise/walking as tolerated.

## 2024-03-25 NOTE — Assessment & Plan Note (Addendum)
 Well-controlled with Xyzal Followed by [facility] specialist

## 2024-03-25 NOTE — Progress Notes (Unsigned)
 Established Patient Office Visit  Subjective:  Patient ID: Carolyn Robinson, female    DOB: 28-May-1951  Age: 73 y.o. MRN: [id number]  CC:  Chief Complaint  Patient presents with   Hypertension    Six month follow up    Hyperlipidemia    Six month follow up    Oral Swelling    Swelling and possible infection in gums     HPI Carolyn Robinson is a 73 y.o. female with past medical history of HTN, PAD, HLD, osteoporosis and chronic sinusitis who presents for annual physical.  HTN: Her BP is WNL today.  She takes amlodipine  5 mg BID currently.  Denies any headache, dizziness, chest pain, dyspnea or palpitations currently.   PAD: She takes aspirin 81 mg once daily and Crestor  20 mg QD. She is status post right iliac artery stent placement (8 x 57 mm VISI Pro stent) and left iliac artery atherectomy? and balloon angioplasty done by Dr. Michelle in 2016 in [place] for severe claudication. She also had previous bilateral vein ablation by Dr. Arthea.  Denies claudication symptoms.  She has Lasix  as needed for leg swelling, but rarely needs it.   Osteoporosis: Her last DEXA scan in 06/24 showed osteoporosis, and started taking alendronate  in 11/24.  She also takes vitamin D 5000 IU QD.   Chronic sinusitis: She takes Xyzal for it.  Followed by [facility]. Denies any recent fever, chills, recent worsening of sinus pressure or postnasal drip.     Past Medical History:  Diagnosis Date   Hypercholesterolemia    Hypertension    PAD (peripheral artery disease) (HCC)    Status post right iliac stent placement and angioplasty of the left iliac artery in 2016 by Dr. Michelle in [place].    Past Surgical History:  Procedure Laterality Date   ABDOMINAL HYSTERECTOMY     ENDOVENOUS ABLATION SAPHENOUS VEIN W/ LASER Bilateral    stent in r leg      Family History  Problem Relation Age of Onset   Colon cancer Paternal Uncle    Colon cancer Paternal Uncle    Colon cancer Maternal Aunt     Colon cancer Cousin     Social History   Socioeconomic History   Marital status: Divorced    Spouse name: Not on file   Number of children: Not on file   Years of education: Not on file   Highest education level: GED or equivalent  Occupational History   Not on file  Tobacco Use   Smoking status: Former    Current packs/day: 0.00    Types: Cigarettes    Quit date: 2003    Years since quitting: 22.7   Smokeless tobacco: Never  Substance and Sexual Activity   Alcohol use: Yes    Comment: occ   Drug use: No   Sexual activity: Not on file  Other Topics Concern   Not on file  Social History Narrative   Not on file   Social Drivers of Health   Financial Resource Strain: High Risk (08/26/2023)   Overall Financial Resource Strain (CARDIA)    Difficulty of Paying Living Expenses: Hard  Food Insecurity: Food Insecurity Present (08/26/2023)   Hunger Vital Sign    Worried About Running Out of Food in the Last Year: Sometimes true    Ran Out of Food in the Last Year: Sometimes true  Transportation Needs: No Transportation Needs (08/26/2023)   PRAPARE - Administrator, Civil Service (Medical):  No    Lack of Transportation (Non-Medical): No  Physical Activity: Sufficiently Active (08/26/2023)   Exercise Vital Sign    Days of Exercise per Week: 5 days    Minutes of Exercise per Session: 60 min  Stress: No Stress Concern Present (08/26/2023)   [employer] of Occupational Health - Occupational Stress Questionnaire    Feeling of Stress : Not at all  Social Connections: Socially Isolated (08/26/2023)   Social Connection and Isolation Panel    Frequency of Communication with Friends and Family: More than three times a week    Frequency of Social Gatherings with Friends and Family: Three times a week    Attends Religious Services: Never    Active Member of Clubs or Organizations: No    Attends Engineer, structural: Not on file    Marital Status: Divorced   Intimate Partner Violence: Not on file    Outpatient Medications Prior to Visit  Medication Sig Dispense Refill   rosuvastatin  (CRESTOR ) 10 MG tablet Take 2 tablets (20 mg total) by mouth daily. (Patient taking differently: Take 10 mg by mouth daily.) 90 tablet 3   alendronate  (FOSAMAX ) 70 MG tablet Take 1 tablet (70 mg total) by mouth once a week. 4 tablet 11   amLODipine  (NORVASC ) 5 MG tablet Take 1 tablet (5 mg total) by mouth 2 (two) times daily. 180 tablet 1   Aspirin Buf,CaCarb-MgCarb-MgO, 81 MG TABS Take by mouth.     aspirin EC 81 MG tablet Take 81 mg by mouth daily. Swallow whole.     Cholecalciferol (VITAMIN D-3) 125 MCG (5000 UT) TABS Take by mouth.     fenofibrate  160 MG tablet Take 1 tablet (160 mg total) by mouth daily. 90 tablet 3   furosemide  (LASIX ) 20 MG tablet Take 1 tablet (20 mg total) by mouth as needed. 30 tablet 2   Lactobacillus (PROBIOTIC ACIDOPHILUS PO) Take by mouth.     levocetirizine (XYZAL) 5 MG tablet Take 5 mg by mouth every evening.     ZINC-VITAMIN C PO Take by mouth.     zolpidem (AMBIEN) 5 MG tablet Take 5 mg by mouth at bedtime as needed for sleep.     No facility-administered medications prior to visit.    Allergies  Allergen Reactions   Ace Inhibitors    Ciprofloxacin    Ezetimibe    Hctz [Hydrochlorothiazide]    Losartan    Sulfa Antibiotics     ROS Review of Systems    Objective:    Physical Exam  BP (!) 147/76   Pulse 78   Ht 5' 2.5 (1.588 m)   Wt 119 lb (54 kg)   SpO2 96%   BMI 21.42 kg/m  Wt Readings from Last 3 Encounters:  03/25/24 119 lb (54 kg)  11/12/23 125 lb 3.2 oz (56.8 kg)  08/29/23 133 lb 6.4 oz (60.5 kg)    No results found for: TSH Lab Results  Component Value Date   WBC 9.1 05/09/2019   HGB 14.5 05/09/2019   HCT 45.3 05/09/2019   MCV 105.8 (H) 05/09/2019   PLT 253 05/09/2019   Lab Results  Component Value Date   NA 138 05/08/2019   K 4.2 05/08/2019   CO2 24 05/08/2019   GLUCOSE 103 (H)  05/08/2019   BUN 10 05/08/2019   CREATININE 0.97 05/08/2019   BILITOT 0.7 05/08/2019   ALKPHOS 95 05/08/2019   AST 71 (H) 05/08/2019   ALT 43 05/08/2019   PROT 7.9  05/08/2019   ALBUMIN 4.3 05/08/2019   CALCIUM  9.7 05/08/2019   ANIONGAP 9 05/08/2019   No results found for: CHOL No results found for: HDL No results found for: LDLCALC No results found for: TRIG No results found for: CHOLHDL No results found for: YHAJ8R    Assessment & Plan:   Problem List Items Addressed This Visit   None   No orders of the defined types were placed in this encounter.   Follow-up: No follow-ups on file.    Suzzane MARLA Blanch, MD

## 2024-03-25 NOTE — Assessment & Plan Note (Signed)
 BP Readings from Last 1 Encounters:  03/25/24 138/62   Usually Well-controlled with Amlodipine  5 mg BID, had fluctuating BP with once daily dosing Has had rash with ACEi and ARB, did not tolerate hydrochlorothiazide as well Counseled for compliance with the medications Advised DASH diet and moderate exercise/walking, at least 150 mins/week

## 2024-03-25 NOTE — Assessment & Plan Note (Signed)
 Status post right iliac artery stent placement (8 x 57 mm VISI Pro stent) and left iliac artery atherectomy? and balloon angioplasty done by Dr. Jenness Corner in 2016 in [place]  On aspirin and statin Denies claudication symptoms currently Followed by cardiology

## 2024-03-25 NOTE — Assessment & Plan Note (Addendum)
 On Crestor  10 mg QD and fenofibrate  160 mg QD, refilled Last lipid profile reviewed

## 2024-03-26 ENCOUNTER — Ambulatory Visit: Payer: Self-pay | Admitting: Internal Medicine

## 2024-03-26 DIAGNOSIS — F4320 Adjustment disorder, unspecified: Secondary | ICD-10-CM | POA: Insufficient documentation

## 2024-03-26 DIAGNOSIS — Z0001 Encounter for general adult medical examination with abnormal findings: Secondary | ICD-10-CM | POA: Insufficient documentation

## 2024-03-26 LAB — CMP14+EGFR
ALT: 10 IU/L (ref 0–32)
AST: 19 IU/L (ref 0–40)
Albumin: 4.6 g/dL (ref 3.9–4.9)
Alkaline Phosphatase: 51 IU/L (ref 44–121)
BUN/Creatinine Ratio: 17 (ref 12–28)
BUN: 22 mg/dL (ref 8–27)
Bilirubin Total: 0.3 mg/dL (ref 0.0–1.2)
CO2: 21 mmol/L (ref 20–29)
Calcium: 9.6 mg/dL (ref 8.7–10.3)
Chloride: 103 mmol/L (ref 96–106)
Creatinine, Ser: 1.27 mg/dL — ABNORMAL HIGH (ref 0.57–1.00)
Globulin, Total: 2.2 g/dL (ref 1.5–4.5)
Glucose: 83 mg/dL (ref 70–99)
Potassium: 4.2 mmol/L (ref 3.5–5.2)
Sodium: 140 mmol/L (ref 134–144)
Total Protein: 6.8 g/dL (ref 6.0–8.5)
eGFR: 45 mL/min/1.73 — ABNORMAL LOW (ref >59)

## 2024-03-26 LAB — CBC WITH DIFFERENTIAL/PLATELET
Basophils Absolute: 0 x10E3/uL (ref 0.0–0.2)
Basos: 0 %
EOS (ABSOLUTE): 0.2 x10E3/uL (ref 0.0–0.4)
Eos: 2 %
Hematocrit: 35.4 % (ref 34.0–46.6)
Hemoglobin: 11.4 g/dL (ref 11.1–15.9)
Immature Grans (Abs): 0 x10E3/uL (ref 0.0–0.1)
Immature Granulocytes: 0 %
Lymphocytes Absolute: 2.2 x10E3/uL (ref 0.7–3.1)
Lymphs: 24 %
MCH: 28.9 pg (ref 26.6–33.0)
MCHC: 32.2 g/dL (ref 31.5–35.7)
MCV: 90 fL (ref 79–97)
Monocytes Absolute: 0.7 x10E3/uL (ref 0.1–0.9)
Monocytes: 7 %
Neutrophils Absolute: 6 x10E3/uL (ref 1.4–7.0)
Neutrophils: 67 %
Platelets: 318 x10E3/uL (ref 150–450)
RBC: 3.94 x10E6/uL (ref 3.77–5.28)
RDW: 13.8 % (ref 11.7–15.4)
WBC: 9.1 x10E3/uL (ref 3.4–10.8)

## 2024-03-26 LAB — HEMOGLOBIN A1C
Est. average glucose Bld gHb Est-mCnc: 114 mg/dL
Hgb A1c MFr Bld: 5.6 % (ref 4.8–5.6)

## 2024-03-26 LAB — LIPID PANEL
Chol/HDL Ratio: 1.9 ratio (ref 0.0–4.4)
Cholesterol, Total: 151 mg/dL (ref 100–199)
HDL: 80 mg/dL (ref >39)
LDL Chol Calc (NIH): 49 mg/dL (ref 0–99)
Triglycerides: 127 mg/dL (ref 0–149)
VLDL Cholesterol Cal: 22 mg/dL (ref 5–40)

## 2024-03-26 NOTE — Assessment & Plan Note (Signed)
 Recently lost her brother in 08/25 She has adequate support from community Prefers to avoid any medicine for now

## 2024-03-26 NOTE — Assessment & Plan Note (Addendum)
 Physical exam as documented. Fasting blood tests today. Advised to get Shingrix and Tdap vaccines at local pharmacy - she prefers to avoid any vaccine.

## 2024-03-26 NOTE — Assessment & Plan Note (Signed)
 Has a right-sided lower jaw pain with concern for dental abscess currently Started Augmentin  Advised to contact dental surgeon

## 2024-04-07 ENCOUNTER — Ambulatory Visit: Payer: Self-pay

## 2024-04-07 NOTE — Telephone Encounter (Signed)
 FYI Only or Action Required?: Action required by provider: clinical question for provider.  Patient was last seen in primary care on 03/25/2024 by Tobie Suzzane POUR, MD.  Called Nurse Triage reporting Cough.  Symptoms began 03/26/2024.  Interventions attempted: Prescription medications: has been taking ABX prescribed for gum infection.  Symptoms are: everything is better except for cough and mucous.  Triage Disposition: See Physician Within 24 Hours  Patient/caregiver understands and will follow disposition?: No, wishes to speak with PCP   Copied from CRM #[id number]. Topic: Clinical - Red Word Triage >> Apr 07, 2024 11:13 AM Carolyn Robinson wrote: Red Word that prompted transfer to Nurse Triage: Patient is getting over covid, taking an antibiotic that she was given for an  infection in gums. Patient is still spitting up green mucus. Reason for Disposition  SEVERE coughing spells (e.g., whooping sound after coughing, vomiting after coughing)  Answer Assessment - Initial Assessment Questions 1. ONSET: When did the cough begin?      03/26/2024 2. SEVERITY: How bad is the cough today?     moderate 3. SPUTUM: Describe the color of your sputum (e.g., none, dry cough; clear, white, yellow, green)     green 4. HEMOPTYSIS: Are you coughing up any blood? If Yes, ask: How much? (e.g., flecks, streaks, tablespoons, etc.)     no 5. DIFFICULTY BREATHING: Are you having difficulty breathing? If Yes, ask: How bad is it? (e.g., mild, moderate, severe)      no 6. FEVER: Do you have a fever? If Yes, ask: What is your temperature, how was it measured, and when did it start?     no 7. CARDIAC HISTORY: Do you have any history of heart disease? (e.g., heart attack, congestive heart failure)      na 8. LUNG HISTORY: Do you have any history of lung disease?  (e.g., pulmonary embolus, asthma, emphysema)     na 9. PE RISK FACTORS: Do you have a history of blood clots? (or: recent major surgery,  recent prolonged travel, bedridden)     na 10. OTHER SYMPTOMS: Do you have any other symptoms? (e.g., runny nose, wheezing, chest pain)       no 11. PREGNANCY: Is there any chance you are pregnant? When was your last menstrual period?       na 12. TRAVEL: Have you traveled out of the country in the last month? (e.g., travel history, exposures)       Na   Offered pt an appt to come in to see PCP: pt stated she can;t at this time b/c  Pt took at home - results COVID  Protocols used: Cough - Acute Productive-A-AH

## 2024-04-08 NOTE — Telephone Encounter (Signed)
 Spoke to pt , call was disconnected.

## 2024-04-09 ENCOUNTER — Other Ambulatory Visit: Payer: Self-pay | Admitting: Internal Medicine

## 2024-04-09 ENCOUNTER — Ambulatory Visit: Payer: Self-pay

## 2024-04-09 DIAGNOSIS — J329 Chronic sinusitis, unspecified: Secondary | ICD-10-CM

## 2024-04-09 MED ORDER — AZITHROMYCIN 250 MG PO TABS: ORAL_TABLET | ORAL | 0 refills | Status: AC

## 2024-04-09 NOTE — Telephone Encounter (Signed)
Pt informed

## 2024-04-09 NOTE — Telephone Encounter (Signed)
 FYI Only or Action Required?: FYI only for provider. Coughing up green sputum-finished Augmentin  on 9/17. Asking for follow up call.   Patient was last seen in primary care on 03/25/2024 by Tobie Suzzane POUR, MD.  Called Nurse Triage reporting Cough.  Symptoms began several days ago.  Interventions attempted: Rest, hydration, or home remedies.  Symptoms are: unchanged.  Triage Disposition: See Physician Within 24 Hours  Patient/caregiver understands and will follow disposition?: No, wishes to speak with PCP  Copied from CRM [id number]. Topic: Clinical - Red Word Triage >> Apr 09, 2024 12:11 PM Wess RAMAN wrote: Red Word that prompted transfer to Nurse Triage: Patient recently got over Covid and is still coughing up green stuff. She stated someone called her yesterday morning and told her to take mucinex but she cant take it due to having HBP. Reason for Disposition  SEVERE coughing spells (e.g., whooping sound after coughing, vomiting after coughing)  Answer Assessment - Initial Assessment Questions Patient called to report continued cough with green sputum. Patient was prescribed Augmentin  on 03/25/2024 but she finished that on 9/17. Patient was instructed to take Mucinex for the cough but is concerned with the continued green sputum. Patient is asking for another antibiotic to help with green sputum. Asking for a follow up call.   1. ONSET: When did the cough begin?      Started on 03/26/2024 2. SEVERITY: How bad is the cough today?      moderate 3. SPUTUM: Describe the color of your sputum (e.g., none, dry cough; clear, white, yellow, green)     Green stuff 4. HEMOPTYSIS: Are you coughing up any blood? If Yes, ask: How much? (e.g., flecks, streaks, tablespoons, etc.)     no 5. DIFFICULTY BREATHING: Are you having difficulty breathing? If Yes, ask: How bad is it? (e.g., mild, moderate, severe)      no 6. FEVER: Do you have a fever? If Yes, ask: What is your temperature,  how was it measured, and when did it start?     no 7. CARDIAC HISTORY: Do you have any history of heart disease? (e.g., heart attack, congestive heart failure)      no 8. LUNG HISTORY: Do you have any history of lung disease?  (e.g., pulmonary embolus, asthma, emphysema)     no 9. PE RISK FACTORS: Do you have a history of blood clots? (or: recent major surgery, recent prolonged travel, bedridden)     no 10. OTHER SYMPTOMS: Do you have any other symptoms? (e.g., runny nose, wheezing, chest pain)       no 12. TRAVEL: Have you traveled out of the country in the last month? (e.g., travel history, exposures)       no  Protocols used: Cough - Acute Productive-A-AH

## 2024-04-23 ENCOUNTER — Other Ambulatory Visit: Payer: Self-pay | Admitting: Internal Medicine

## 2024-05-22 ENCOUNTER — Other Ambulatory Visit: Payer: Self-pay | Admitting: Internal Medicine

## 2024-06-03 ENCOUNTER — Other Ambulatory Visit: Payer: Self-pay

## 2024-06-03 DIAGNOSIS — I10 Essential (primary) hypertension: Secondary | ICD-10-CM

## 2024-06-03 DIAGNOSIS — M810 Age-related osteoporosis without current pathological fracture: Secondary | ICD-10-CM

## 2024-06-03 DIAGNOSIS — E782 Mixed hyperlipidemia: Secondary | ICD-10-CM

## 2024-06-03 MED ORDER — FUROSEMIDE 20 MG PO TABS: 20.0000 mg | ORAL_TABLET | ORAL | 0 refills | Status: DC | PRN

## 2024-06-03 MED ORDER — FENOFIBRATE 160 MG PO TABS: 160.0000 mg | ORAL_TABLET | Freq: Every day | ORAL | 0 refills | Status: AC

## 2024-06-03 MED ORDER — AMLODIPINE BESYLATE 5 MG PO TABS
5.0000 mg | ORAL_TABLET | Freq: Two times a day (BID) | ORAL | 0 refills | Status: AC
Start: 2024-06-03 — End: ?

## 2024-06-03 MED ORDER — ROSUVASTATIN CALCIUM 10 MG PO TABS: 20.0000 mg | ORAL_TABLET | Freq: Every day | ORAL | 3 refills | Status: AC

## 2024-06-03 MED ORDER — ALENDRONATE SODIUM 70 MG PO TABS: 70.0000 mg | ORAL_TABLET | ORAL | 11 refills | Status: AC

## 2024-06-09 ENCOUNTER — Ambulatory Visit

## 2024-06-19 ENCOUNTER — Other Ambulatory Visit: Payer: Self-pay | Admitting: Internal Medicine

## 2024-07-23 ENCOUNTER — Other Ambulatory Visit: Payer: Self-pay | Admitting: Internal Medicine

## 2024-08-17 ENCOUNTER — Other Ambulatory Visit: Payer: Self-pay | Admitting: Internal Medicine

## 2024-09-21 ENCOUNTER — Ambulatory Visit: Admitting: Internal Medicine
